# Patient Record
Sex: Female | Born: 2000
Health system: Southern US, Community
[De-identification: ages and names within clinical notes are randomized; demographics above are authoritative.]

## PROBLEM LIST (undated history)

## (undated) ENCOUNTER — Ambulatory Visit: Admission: EM | Discharge status: Absent without leave | Payer: BC Managed Care – PPO

## (undated) DIAGNOSIS — L2084 Intrinsic (allergic) eczema: Secondary | ICD-10-CM

## (undated) DIAGNOSIS — T7840XA Allergy, unspecified, initial encounter: Secondary | ICD-10-CM

## (undated) HISTORY — DX: Intrinsic (allergic) eczema: L20.84

## (undated) HISTORY — PX: INGUINAL HERNIA REPAIR: SUR1180

## (undated) HISTORY — DX: Allergy, unspecified, initial encounter: T78.40XA

---
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
## 2020-02-25 DIAGNOSIS — F4322 Adjustment disorder with anxiety: Secondary | ICD-10-CM | POA: Diagnosis not present

## 2020-03-05 DIAGNOSIS — F4322 Adjustment disorder with anxiety: Secondary | ICD-10-CM | POA: Diagnosis not present

## 2020-03-12 DIAGNOSIS — F4322 Adjustment disorder with anxiety: Secondary | ICD-10-CM | POA: Diagnosis not present

## 2020-06-19 DIAGNOSIS — Z20828 Contact with and (suspected) exposure to other viral communicable diseases: Secondary | ICD-10-CM | POA: Diagnosis not present

## 2020-07-01 DIAGNOSIS — Z1152 Encounter for screening for COVID-19: Secondary | ICD-10-CM | POA: Diagnosis not present

## 2020-10-20 ENCOUNTER — Ambulatory Visit: Admit: 2020-10-20 | Disposition: A | Discharge status: Home / Self Care | Payer: BC Managed Care – PPO

## 2020-10-20 ENCOUNTER — Ambulatory Visit
Admission: EM | Admit: 2020-10-20 | Discharge: 2020-10-20 | Disposition: A | Discharge status: Home / Self Care | Payer: BC Managed Care – PPO | Attending: Emergency Medicine | Admitting: Emergency Medicine

## 2020-10-20 ENCOUNTER — Other Ambulatory Visit: Payer: Self-pay

## 2020-10-20 DIAGNOSIS — Z3201 Encounter for pregnancy test, result positive: Secondary | ICD-10-CM

## 2020-10-20 DIAGNOSIS — N939 Abnormal uterine and vaginal bleeding, unspecified: Secondary | ICD-10-CM | POA: Diagnosis not present

## 2020-10-20 LAB — POCT URINE PREGNANCY: Preg Test, Ur: POSITIVE — AB

## 2020-10-20 NOTE — ED Provider Notes (Signed)
[facility]    CSN: [id number] Arrival date & time: 10/20/20  1544      History   Chief Complaint Chief Complaint  Patient presents with  . Abdominal Pain    HPI Tiffany Dennis is a 19 y.o. female  Presenting for prolonged vaginal bleeding with bilateral lower abdominal cramping.  Cycle started 12/15, still persisting.  States she bleeds for typically 3-4 days.  Not currently on contraceptives.  Denies vaginal discharge, malodor, urinary symptoms.  No nausea, vomiting.  Past Medical History:  Diagnosis Date  . Allergy   . Intrinsic eczema     Patient Active Problem List   Diagnosis Date Noted  . Generalized anxiety disorder 09/30/2019  . Instability of left shoulder joint 03/13/2017    Past Surgical History:  Procedure Laterality Date  . INGUINAL HERNIA REPAIR      OB History   No obstetric history on file.      Home Medications    Prior to Admission medications   Medication Sig Start Date End Date Taking? Authorizing Provider  JUNEL FE 1/20 1-20 MG-MCG tablet Take 1 tablet by mouth daily. 09/03/19   [provider]    Family History Family History  Problem Relation Age of Onset  . Allergic rhinitis Mother   . Diabetes Father   . Hypertension Father   . Diabetes Maternal Grandmother   . Diabetes Paternal Grandfather     Social History Social History   Tobacco Use  . Smoking status: Never Smoker  . Smokeless tobacco: Never Used  Vaping Use  . Vaping Use: Never used  Substance Use Topics  . Alcohol use: No  . Drug use: No     Allergies   Lindane   Review of Systems Review of Systems  Constitutional: Negative for fatigue and fever.  Respiratory: Negative for cough and shortness of breath.   Cardiovascular: Negative for chest pain and palpitations.  Gastrointestinal: Negative for constipation and diarrhea.  Genitourinary: Positive for menstrual problem and pelvic pain. Negative for dysuria, flank pain, frequency,  hematuria, urgency, vaginal bleeding, vaginal discharge and vaginal pain.     Physical Exam Triage Vital Signs ED Triage Vitals  Enc Vitals Group     BP 10/20/20 1719 103/67     Pulse Rate 10/20/20 1719 70     Resp 10/20/20 1719 20     Temp 10/20/20 1719 98.7 F (37.1 C)     Temp Source 10/20/20 1719 Oral     SpO2 10/20/20 1719 98 %     Weight --      Height --      Head Circumference --      Peak Flow --      Pain Score 10/20/20 1708 4     Pain Loc --      Pain Edu? --      Excl. in GC? --    No data found.  Updated Vital Signs BP 103/67 (BP Location: Right Arm)   Pulse 70   Temp 98.7 F (37.1 C) (Oral)   Resp 20   LMP 10/13/2020   SpO2 98%   Visual Acuity Right Eye Distance:   Left Eye Distance:   Bilateral Distance:    Right Eye Near:   Left Eye Near:    Bilateral Near:     Physical Exam Constitutional:      General: She is not in acute distress. HENT:     Head: Normocephalic and atraumatic.  Eyes:  General: No scleral icterus.    Pupils: Pupils are equal, round, and reactive to light.  Cardiovascular:     Rate and Rhythm: Normal rate.  Pulmonary:     Effort: Pulmonary effort is normal.  Abdominal:     General: Bowel sounds are normal.     Palpations: Abdomen is soft.     Tenderness: There is no abdominal tenderness. There is no right CVA tenderness, left CVA tenderness or guarding.  Skin:    Coloration: Skin is not jaundiced or pale.  Neurological:     Mental Status: She is alert and oriented to person, place, and time.      UC Treatments / Results  Labs (all labs ordered are listed, but only abnormal results are displayed) Labs Reviewed  POCT URINE PREGNANCY - Abnormal; Notable for the following components:      Result Value   Preg Test, Ur Positive (*)    All other components within normal limits    EKG   Radiology No results found.  Procedures Procedures (including critical care time)  Medications Ordered in  UC Medications - No data to display  Initial Impression / Assessment and Plan / UC Course  I have reviewed the triage vital signs and the nursing notes.  Pertinent labs & imaging results that were available during my care of the patient were reviewed by me and considered in my medical decision making (see chart for details).     Urine pregnancy positive: Threatened VS complete abortion.  Referred to women's for further diagnostic imaging.  Return precautions discussed, pt verbalized understanding and is agreeable to plan. Final Clinical Impressions(s) / UC Diagnoses   Final diagnoses:  Positive pregnancy test  Vaginal bleeding   Discharge Instructions   None    ED Prescriptions    None     PDMP not reviewed this encounter.   Odette Fraction [place], [place] 10/20/20 1824

## 2020-10-20 NOTE — ED Triage Notes (Signed)
Pt states started her cycle on 12/15, started having strong lower abdominal cramping a week prior to starting cycle. States is on day 8 of cycle and still having lower abdominal cramping.

## 2020-10-26 ENCOUNTER — Inpatient Hospital Stay (HOSPITAL_COMMUNITY)
Admission: AD | Admit: 2020-10-26 | Discharge: 2020-10-26 | Disposition: A | Discharge status: Home / Self Care | Payer: BC Managed Care – PPO | Attending: Family Medicine | Admitting: Family Medicine

## 2020-10-26 ENCOUNTER — Encounter (HOSPITAL_COMMUNITY): Payer: Self-pay | Admitting: Family Medicine

## 2020-10-26 ENCOUNTER — Inpatient Hospital Stay (HOSPITAL_COMMUNITY): Payer: BC Managed Care – PPO

## 2020-10-26 ENCOUNTER — Other Ambulatory Visit: Payer: Self-pay

## 2020-10-26 ENCOUNTER — Ambulatory Visit: Payer: Self-pay

## 2020-10-26 DIAGNOSIS — Z3A01 Less than 8 weeks gestation of pregnancy: Secondary | ICD-10-CM | POA: Insufficient documentation

## 2020-10-26 DIAGNOSIS — O3680X0 Pregnancy with inconclusive fetal viability, not applicable or unspecified: Secondary | ICD-10-CM | POA: Diagnosis not present

## 2020-10-26 DIAGNOSIS — O209 Hemorrhage in early pregnancy, unspecified: Secondary | ICD-10-CM

## 2020-10-26 DIAGNOSIS — O26891 Other specified pregnancy related conditions, first trimester: Secondary | ICD-10-CM

## 2020-10-26 DIAGNOSIS — R1031 Right lower quadrant pain: Secondary | ICD-10-CM | POA: Insufficient documentation

## 2020-10-26 DIAGNOSIS — R109 Unspecified abdominal pain: Secondary | ICD-10-CM

## 2020-10-26 DIAGNOSIS — Z3A00 Weeks of gestation of pregnancy not specified: Secondary | ICD-10-CM | POA: Diagnosis not present

## 2020-10-26 DIAGNOSIS — O26899 Other specified pregnancy related conditions, unspecified trimester: Secondary | ICD-10-CM

## 2020-10-26 LAB — WET PREP, GENITAL
Sperm: NONE SEEN
Trich, Wet Prep: NONE SEEN
WBC, Wet Prep HPF POC: NONE SEEN
Yeast Wet Prep HPF POC: NONE SEEN

## 2020-10-26 LAB — CBC WITH DIFFERENTIAL/PLATELET
Abs Immature Granulocytes: 0 10*3/uL (ref 0.00–0.07)
Basophils Absolute: 0 10*3/uL (ref 0.0–0.1)
Basophils Relative: 0 %
Eosinophils Absolute: 0 10*3/uL (ref 0.0–0.5)
Eosinophils Relative: 0 %
HCT: 38.1 % (ref 36.0–46.0)
Hemoglobin: 13.2 g/dL (ref 12.0–15.0)
Immature Granulocytes: 0 %
Lymphocytes Relative: 44 %
Lymphs Abs: 1.8 10*3/uL (ref 0.7–4.0)
MCH: 33 pg (ref 26.0–34.0)
MCHC: 34.6 g/dL (ref 30.0–36.0)
MCV: 95.3 fL (ref 80.0–100.0)
Monocytes Absolute: 0.4 10*3/uL (ref 0.1–1.0)
Monocytes Relative: 10 %
Neutro Abs: 1.9 10*3/uL (ref 1.7–7.7)
Neutrophils Relative %: 46 %
Platelets: 235 10*3/uL (ref 150–400)
RBC: 4 MIL/uL (ref 3.87–5.11)
RDW: 12.2 % (ref 11.5–15.5)
WBC: 4.1 10*3/uL (ref 4.0–10.5)
nRBC: 0 % (ref 0.0–0.2)

## 2020-10-26 LAB — HIV ANTIBODY (ROUTINE TESTING W REFLEX): HIV Screen 4th Generation wRfx: NONREACTIVE

## 2020-10-26 LAB — ABO/RH: ABO/RH(D): O POS

## 2020-10-26 LAB — HCG, QUANTITATIVE, PREGNANCY: hCG, Beta Chain, Quant, S: 9 m[IU]/mL — ABNORMAL HIGH (ref <5)

## 2020-10-26 NOTE — Discharge Instructions (Signed)
Human Chorionic Gonadotropin Test Why am I having this test? A human chorionic gonadotropin (hCG) test is done to determine whether you are pregnant. It can also be used:  To diagnose an abnormal pregnancy.  To determine whether you have had a failed pregnancy (miscarriage) or are at risk of one. What is being tested? This test checks the level of the human chorionic gonadotropin (hCG) hormone in the blood. This hormone is produced during pregnancy by the cells that form the placenta. The placenta is the organ that grows inside your womb (uterus) to nourish a developing baby. When you are pregnant, hCG can be detected in your blood or urine 7 to 8 days before your missed period. It continues to go up for the first 8-10 weeks of pregnancy. The presence of hCG in your blood can be measured with several different types of tests. You may have:  A urine test. ? Because this hormone is eliminated from your body by your kidneys, you may have a urine test to find out whether you are pregnant. A home pregnancy test detects whether there is hCG in your urine. ? A urine test only shows whether there is hCG in your urine. It does not measure how much.  A qualitative blood test. ? You may have this type of blood test to find out if you are pregnant. ? This blood test only shows whether there is hCG in your blood. It does not measure how much.  A quantitative blood test. ? This type of blood test measures the amount of hCG in your blood. ? You may have this test to:  Diagnose an abnormal pregnancy.  Check whether you have had a miscarriage.  Determine whether you are at risk of a miscarriage. What kind of sample is taken?     Two kinds of samples may be collected to test for the hCG hormone.  Blood. It is usually collected by inserting a needle into a blood vessel.  Urine. It is usually collected by urinating into a germ-free (sterile) specimen cup. It is best to collect the sample the first  time you urinate in the morning. How do I prepare for this test? No preparation is needed for a blood test.  For the urine test:  Let your health care provider know about: ? All medicines you are taking, including vitamins, herbs, creams, and over-the-counter medicines. ? Any blood in your urine. This may interfere with the result.  Do not drink too much fluid. Drink as you normally would, or as directed by your health care provider. How are the results reported? Depending on the type of test that you have, your test results may be reported as values. Your health care provider will compare your results to normal ranges that were established after testing a large group of people (reference ranges). Reference ranges may vary among labs and hospitals. For this test, common reference ranges that show absence of pregnancy are:  Quantitative hCG blood levels: less than 5 IU/L. Other results will be reported as either positive or negative. For this test, normal results (meaning the absence of pregnancy) are:  Negative for hCG in the urine test.  Negative for hCG in the qualitative blood test. What do the results mean? Urine and qualitative blood test  A negative result could mean: ? That you are not pregnant. ? That the test was done too early in your pregnancy to detect hCG in your blood or urine. If you still have other signs   of pregnancy, the test will be repeated.  A positive result means: ? That you are most likely pregnant. Your health care provider may confirm your pregnancy with an imaging study (ultrasound) of your uterus, if needed. Quantitative blood test Results of the quantitative hCG blood test will be interpreted as follows:  Less than 5 IU/L: You are most likely not pregnant.  Greater than 25 IU/L: You are most likely pregnant.  hCG levels that are higher than expected: ? You are pregnant with twins. ? You have abnormal growths in the uterus.  hCG levels that are  rising more slowly than expected: ? You have an ectopic pregnancy (also called a tubal pregnancy).  hCG levels that are falling: ? You may be having a miscarriage. Talk with your health care provider about what your results mean. Questions to ask your health care provider Ask your health care provider, or the department that is doing the test:  When will my results be ready?  How will I get my results?  What are my treatment options?  What other tests do I need?  What are my next steps? Summary  A human chorionic gonadotropin test is done to determine whether you are pregnant.  When you are pregnant, hCG can be detected in your blood or urine 7 to 8 days before your missed period. It continues to go up for the first 8-10 weeks of pregnancy.  Your hCG level can be measured with different types of tests. You may have a urine test, a qualitative blood test, or a quantitative blood test.  Talk with your health care provider about what your results mean. This information is not intended to replace advice given to you by your health care provider. Make sure you discuss any questions you have with your health care provider. Document Revised: 09/17/2017 Document Reviewed: 09/17/2017 Elsevier Patient Education  2020 Elsevier Inc.  

## 2020-10-26 NOTE — MAU Provider Note (Addendum)
Chief Complaint:  Abdominal Pain and Possible Pregnancy    HPI: Tiffany Dennis is a 19 y.o. G1P0 at [redacted]w[redacted]d who presents to maternity admissions with chief complaint of vaginal bleeding. This is a new problem, onset about 10 days ago. Patient also c/o RLQ pain, onset coinciding with onset of bleeding. Patient's abdominal pain is suprapubic, radiates to her RLQ and is 3/10 at its apex and resolves spontaneously without intervention. She has not taken medication for this complaint  Patient's bleeding is irregular and resolved about two days ago. She denies ever saturating a pad or her underwear.   Patient presented to Urgent Care last week for symptoms and was told to report to MAU for ultrasound for possible miscarriage. Denies discharge, fever, falls, or recent illness.   Pregnancy Course: 6 weeks 2 days via LMP  Past Medical History:  Diagnosis Date  . Allergy   . Intrinsic eczema    OB History  Gravida Para Term Preterm AB Living  1            SAB IAB Ectopic Multiple Live Births               # Outcome Date GA Lbr Len/2nd Weight Sex Delivery Anes PTL Lv  1 Current            Past Surgical History:  Procedure Laterality Date  . INGUINAL HERNIA REPAIR     Family History  Problem Relation Age of Onset  . Allergic rhinitis Mother   . Diabetes Father   . Hypertension Father   . Diabetes Maternal Grandmother   . Diabetes Paternal Grandfather    Social History   Tobacco Use  . Smoking status: Never Smoker  . Smokeless tobacco: Never Used  Vaping Use  . Vaping Use: Never used  Substance Use Topics  . Alcohol use: No  . Drug use: No   Allergies  Allergen Reactions  . Lindane Swelling   Medications Prior to Admission  Medication Sig Dispense Refill Last Dose  . JUNEL FE 1/20 1-20 MG-MCG tablet Take 1 tablet by mouth daily.       I have reviewed patient's Past Medical Hx, Surgical Hx, Family Hx, Social Hx, medications and allergies.   ROS:  Review of Systems   Constitutional: Negative for fatigue.  HENT: Negative for congestion.   Respiratory: Negative for cough and shortness of breath.   Gastrointestinal: Negative for abdominal pain.  Genitourinary: Negative for vaginal bleeding, vaginal discharge and vaginal pain.  All other systems reviewed and are negative.   Physical Exam   Patient Vitals for the past 24 hrs:  BP Temp Temp src Pulse Resp SpO2 Height Weight  10/26/20 1442 106/68 98.5 F (36.9 C) Oral 77 18 100 % 5\' 7"  (1.702 m) 78.7 kg    Constitutional: Well-developed, well-nourished female in no acute distress.  Cardiovascular: normal rate  Respiratory: normal effort GI: Abd soft, non-tender, gravid appropriate for gestational age.  MS: normal ROM Neurologic: Alert and oriented x 4.     Labs: Results for orders placed or performed during the hospital encounter of 10/26/20 (from the past 24 hour(s))  CBC with Differential/Platelet     Status: None   Collection Time: 10/26/20  1:43 PM  Result Value Ref Range   WBC 4.1 4.0 - 10.5 K/uL   RBC 4.00 3.87 - 5.11 MIL/uL   Hemoglobin 13.2 12.0 - 15.0 g/dL   HCT 10/28/20 72.6 - 20.3 %   MCV 95.3 80.0 - 100.0  fL   MCH 33.0 26.0 - 34.0 pg   MCHC 34.6 30.0 - 36.0 g/dL   RDW 16.1 09.6 - 04.5 %   Platelets 235 150 - 400 K/uL   nRBC 0.0 0.0 - 0.2 %   Neutrophils Relative % 46 %   Neutro Abs 1.9 1.7 - 7.7 K/uL   Lymphocytes Relative 44 %   Lymphs Abs 1.8 0.7 - 4.0 K/uL   Monocytes Relative 10 %   Monocytes Absolute 0.4 0.1 - 1.0 K/uL   Eosinophils Relative 0 %   Eosinophils Absolute 0.0 0.0 - 0.5 K/uL   Basophils Relative 0 %   Basophils Absolute 0.0 0.0 - 0.1 K/uL   Immature Granulocytes 0 %   Abs Immature Granulocytes 0.00 0.00 - 0.07 K/uL  hCG, quantitative, pregnancy     Status: Abnormal   Collection Time: 10/26/20  1:43 PM  Result Value Ref Range   hCG, Beta Chain, Quant, S 9 (H) <5 mIU/mL  ABO/Rh     Status: None   Collection Time: 10/26/20  1:43 PM  Result Value Ref Range    ABO/RH(D) O POS    No rh immune globuloin      NOT A RH IMMUNE GLOBULIN CANDIDATE, PT RH POSITIVE Performed at [facility], [address], [place], [place] [address]     Imaging:  No evidence of intrauterine pregnancy on [place] today. Unremarkable pelvic ultrasound.  MAU Course: Orders Placed This Encounter  Procedures  . Wet prep, genital  . US OB LESS THAN 14 WEEKS WITH OB TRANSVAGINAL  . CBC with Differential/Platelet  . hCG, quantitative, pregnancy  . HIV Antibody (routine testing w rflx)  . RPR  . ABO/Rh   No orders of the defined types were placed in this encounter.   MDM: Orders Placed This Encounter  Procedures  . Wet prep, genital  . US OB LESS THAN 14 WEEKS WITH OB TRANSVAGINAL  . CBC with Differential/Platelet  . hCG, quantitative, pregnancy  . HIV Antibody (routine testing w rflx)  . RPR  . ABO/Rh  . Discharge patient   Results for orders placed or performed during the hospital encounter of 10/26/20 (from the past 24 hour(s))  CBC with Differential/Platelet     Status: None   Collection Time: 10/26/20  1:43 PM  Result Value Ref Range   WBC 4.1 4.0 - 10.5 K/uL   RBC 4.00 3.87 - 5.11 MIL/uL   Hemoglobin 13.2 12.0 - 15.0 g/dL   HCT 19.1 47.8 - 29.5 %   MCV 95.3 80.0 - 100.0 fL   MCH 33.0 26.0 - 34.0 pg   MCHC 34.6 30.0 - 36.0 g/dL   RDW 62.1 30.8 - 65.7 %   Platelets 235 150 - 400 K/uL   nRBC 0.0 0.0 - 0.2 %   Neutrophils Relative % 46 %   Neutro Abs 1.9 1.7 - 7.7 K/uL   Lymphocytes Relative 44 %   Lymphs Abs 1.8 0.7 - 4.0 K/uL   Monocytes Relative 10 %   Monocytes Absolute 0.4 0.1 - 1.0 K/uL   Eosinophils Relative 0 %   Eosinophils Absolute 0.0 0.0 - 0.5 K/uL   Basophils Relative 0 %   Basophils Absolute 0.0 0.0 - 0.1 K/uL   Immature Granulocytes 0 %   Abs Immature Granulocytes 0.00 0.00 - 0.07 K/uL  hCG, quantitative, pregnancy     Status: Abnormal   Collection Time: 10/26/20  1:43 PM  Result Value Ref Range   hCG,  Beta Chain, Quant,  S 9 (H) <5 mIU/mL  ABO/Rh     Status: None   Collection Time: 10/26/20  1:43 PM  Result Value Ref Range   ABO/RH(D) O POS    No rh immune globuloin      NOT A RH IMMUNE GLOBULIN CANDIDATE, PT RH POSITIVE Performed at [facility], [address], [place], [place] [address]   HIV Antibody (routine testing w rflx)     Status: None   Collection Time: 10/26/20  1:43 PM  Result Value Ref Range   HIV Screen 4th Generation wRfx Non Reactive Non Reactive  Wet prep, genital     Status: Abnormal   Collection Time: 10/26/20  3:41 PM   Specimen: Vaginal  Result Value Ref Range   Yeast Wet Prep HPF POC NONE SEEN NONE SEEN   Trich, Wet Prep NONE SEEN NONE SEEN   Clue Cells Wet Prep HPF POC PRESENT (A) NONE SEEN   WBC, Wet Prep HPF POC NONE SEEN NONE SEEN   Sperm NONE SEEN    US OB LESS THAN 14 WEEKS WITH OB TRANSVAGINAL  Result Date: 10/26/2020 CLINICAL DATA:  Vaginal bleeding, beta HCG not available at time of imaging EXAM: OBSTETRIC <14 WK [place] AND TRANSVAGINAL OB US TECHNIQUE: Both transabdominal and transvaginal ultrasound examinations were performed for complete evaluation of the gestation as well as the maternal uterus, adnexal regions, and pelvic cul-de-sac. Transvaginal technique was performed to assess early pregnancy. COMPARISON:  None. FINDINGS: Intrauterine gestational sac: None Yolk sac:  Not Visualized. Embryo:  Not Visualized. Cardiac Activity: Not Visualized. Maternal uterus/adnexae: The uterus is anteverted and is unremarkable. Endometrium measures 3 mm. Right ovary measures 2.8 x 1.9 x 1.6 cm and the left ovary measures 2.9 x 1.4 x 1.8 cm. No adnexal masses. No free fluid. IMPRESSION: 1. No evidence of intrauterine pregnancy on this exam. Please correlate with serum beta HCG levels. 2. Otherwise unremarkable pelvic ultrasound. Electronically Signed   By: Sharlet Salina M.D.   On: 10/26/2020 15:19    Plan: Pregnancy of unknown location Blood type O POS Discharge home in stable  condition.  Follow up appointment on Thursday at 2pm at [facility] for repeat beta hCG.     Acquanetta Belling, [place] 10/26/2020  Attestation of Supervision of Student:  I confirm that I have verified the information documented in the nurse midwife student's note and that I have also personally reperformed the history, physical exam and all medical decision making activities.  I have verified that all services and findings are accurately documented in this student's note; and I agree with management and plan as outlined in the documentation. I have also made any necessary editorial changes.  --Quant hCG of 9 in setting of uncertain LMP and irregular menstrual cycle --Discussed importance of repeat quant to differentiate between early pregnancy and recent miscarriage --Clue cells not c/w physical exam, treatment deferred --Discharge home in stable condition with ectopic precautions  Calvert Cantor, CNM [facility] [employer], [facility] 10/26/2020 9:06 PM

## 2020-10-26 NOTE — MAU Note (Signed)
This past wk, she went to [facility] for abd pain.  Pain started started a wk before expected period. Period started on 12/15, bleeding was ~10days, much longer then usual. UC said possible miscarriage, to come here for [place]

## 2020-10-27 LAB — RPR: RPR Ser Ql: NONREACTIVE

## 2020-10-27 LAB — GC/CHLAMYDIA PROBE AMP (~~LOC~~) NOT AT ARMC
Chlamydia: POSITIVE — AB
Comment: NEGATIVE
Comment: NORMAL
Neisseria Gonorrhea: NEGATIVE

## 2020-10-28 ENCOUNTER — Ambulatory Visit (INDEPENDENT_AMBULATORY_CARE_PROVIDER_SITE_OTHER): Payer: BC Managed Care – PPO | Admitting: General Practice

## 2020-10-28 ENCOUNTER — Ambulatory Visit: Payer: BC Managed Care – PPO | Attending: Internal Medicine

## 2020-10-28 ENCOUNTER — Other Ambulatory Visit: Payer: Self-pay

## 2020-10-28 ENCOUNTER — Other Ambulatory Visit: Payer: Self-pay | Admitting: Medical

## 2020-10-28 VITALS — BP 111/66 | HR 71

## 2020-10-28 DIAGNOSIS — O3680X0 Pregnancy with inconclusive fetal viability, not applicable or unspecified: Secondary | ICD-10-CM

## 2020-10-28 DIAGNOSIS — A749 Chlamydial infection, unspecified: Secondary | ICD-10-CM

## 2020-10-28 DIAGNOSIS — Z23 Encounter for immunization: Secondary | ICD-10-CM

## 2020-10-28 LAB — BETA HCG QUANT (REF LAB): hCG Quant: 2 m[IU]/mL

## 2020-10-28 MED ORDER — AZITHROMYCIN 250 MG PO TABS: 1000.0000 mg | ORAL_TABLET | Freq: Once | ORAL | 0 refills | Status: AC

## 2020-10-28 NOTE — Progress Notes (Signed)
Patient presents to office today for stat bhcg following up from MAU visit on 12/28. Patient denies pain or bleeding since visit. Discussed with patient we are monitoring your bhcg levels today, results take approximately 2 hours to finalize & will be reviewed with a provider in the office. Discussed we will then call you with results/updated plan of care. Patient verbalized understanding & provided call back number [phone].   Reviewed results with Dr Adrian Blackwater who finds bhcg results negative- patient can follow up for contraception or appt as needed.  Called patient and informed her of bhcg result of 2 and offered appt to discuss birth control. Patient verbalized understanding & would like an appt. Told patient someone from the front office would call her with an appt.   Chase Caller RN BSN 10/28/20

## 2020-10-28 NOTE — Progress Notes (Signed)
   Covid-19 Vaccination Clinic  Name:  [name]    MRN: [id number] DOB: 30-Jan-2001  10/28/2020  [name] was observed post Covid-19 immunization for 15 minutes without incident. She was provided with Vaccine Information Sheet and instruction to access the V-Safe system.   [name] was instructed to call 911 with any severe reactions post vaccine: Marland Kitchen Difficulty breathing  . Swelling of face and throat  . A fast heartbeat  . A bad rash all over body  . Dizziness and weakness   Immunizations Administered    Name Date Dose VIS Date Route   Pfizer COVID-19 Vaccine 10/28/2020  3:51 PM 0.3 mL 08/18/2020 Intramuscular   Manufacturer: [employer], [employer]   Lot: [id number]   NDC: [address]-5

## 2020-10-29 NOTE — Progress Notes (Signed)
Chart reviewed - agree with CMA/RN documentation.  ° °

## 2020-11-18 ENCOUNTER — Ambulatory Visit: Payer: BC Managed Care – PPO | Attending: Internal Medicine

## 2020-11-18 DIAGNOSIS — Z23 Encounter for immunization: Secondary | ICD-10-CM

## 2020-11-18 NOTE — Progress Notes (Signed)
   Covid-19 Vaccination Clinic  Name:  Tiffany Dennis    MRN: [id number] DOB: Sep 15, 2001  11/18/2020  [name] was observed post Covid-19 immunization for 15 minutes without incident. She was provided with Vaccine Information Sheet and instruction to access the V-Safe system.   [name] was instructed to call 911 with any severe reactions post vaccine: Marland Kitchen Difficulty breathing  . Swelling of face and throat  . A fast heartbeat  . A bad rash all over body  . Dizziness and weakness   Immunizations Administered    Name Date Dose VIS Date Route   Pfizer COVID-19 Vaccine 11/18/2020  3:54 PM 0.3 mL 08/18/2020 Intramuscular   Manufacturer: [employer], [employer]   Lot: [id number]   NDC: [address]-6

## 2020-11-23 ENCOUNTER — Encounter: Payer: BC Managed Care – PPO | Admitting: Family Medicine

## 2020-12-05 ENCOUNTER — Encounter: Payer: Self-pay | Admitting: Nurse Practitioner

## 2020-12-06 ENCOUNTER — Encounter: Payer: BC Managed Care – PPO | Admitting: Nurse Practitioner

## 2020-12-15 ENCOUNTER — Other Ambulatory Visit: Payer: Self-pay

## 2020-12-15 ENCOUNTER — Ambulatory Visit (INDEPENDENT_AMBULATORY_CARE_PROVIDER_SITE_OTHER): Payer: BC Managed Care – PPO | Admitting: Nurse Practitioner

## 2020-12-15 ENCOUNTER — Encounter: Payer: Self-pay | Admitting: Nurse Practitioner

## 2020-12-15 VITALS — BP 116/72 | HR 67

## 2020-12-15 DIAGNOSIS — Z30011 Encounter for initial prescription of contraceptive pills: Secondary | ICD-10-CM

## 2020-12-15 DIAGNOSIS — O039 Complete or unspecified spontaneous abortion without complication: Secondary | ICD-10-CM

## 2020-12-15 LAB — POCT URINALYSIS DIP (DEVICE)
Bilirubin Urine: NEGATIVE
Glucose, UA: NEGATIVE mg/dL
Leukocytes,Ua: NEGATIVE
Nitrite: NEGATIVE
Protein, ur: NEGATIVE mg/dL
Specific Gravity, Urine: 1.025 (ref 1.005–1.030)
Urobilinogen, UA: 1 mg/dL (ref 0.0–1.0)
pH: 7 (ref 5.0–8.0)

## 2020-12-15 LAB — POCT PREGNANCY, URINE: Preg Test, Ur: NEGATIVE

## 2020-12-15 MED ORDER — NORETHINDRONE ACET-ETHINYL EST 1.5-30 MG-MCG PO TABS: 1.0000 | ORAL_TABLET | Freq: Every day | ORAL | 2 refills | Status: DC

## 2020-12-15 NOTE — Progress Notes (Signed)
   GYNECOLOGY OFFICE VISIT NOTE   History:  20 y.o. G1P0 here today for miscarriage follow up. She denies any abnormal vaginal discharge, bleeding, pelvic pain or other concerns.   Past Medical History:  Diagnosis Date  . Allergy   . Intrinsic eczema     Past Surgical History:  Procedure Laterality Date  . INGUINAL HERNIA REPAIR      The following portions of the patient's history were reviewed and updated as appropriate: allergies, current medications, past family history, past medical history, past social history, past surgical history and problem list.   Health Maintenance:  Normal pap NA  Review of Systems:  Pertinent items noted in HPI and remainder of comprehensive ROS otherwise negative.  Objective:  Physical Exam LMP 09/12/2020   Breastfeeding Unknown 116/72 CONSTITUTIONAL: Well-developed, well-nourished female in no acute distress.  HENT:  Normocephalic, atraumatic. External right and left ear normal.  EYES: Conjunctivae and EOM are normal. Pupils are equal, round.  No scleral icterus.  NECK: Normal range of motion, supple,  SKIN: Skin is warm and dry. No rash noted. Not diaphoretic. No erythema. No pallor. NEUROLOGIC: Alert and oriented to person, place, and time. Normal muscle tone coordination. No cranial nerve deficit noted. PSYCHIATRIC: Normal mood and affect. Normal behavior. Normal judgment and thought content. CARDIOVASCULAR: Normal heart rate noted RESPIRATORY: Effort and breath sounds normal, no problems with respiration noted ABDOMEN: Soft, no distention noted.   PELVIC: Deferred MUSCULOSKELETAL: Normal range of motion. No edema noted.  Labs and Imaging No results found.  Assessment & Plan:  1. Miscarriage Did not realize she was pregnant when she had a miscarriage.  Does not want children at this time.  Had unprotected sex about 10 days ago.  POC pregnancy test today is negative.  Reviewed visit at [facility] with miscarriage documented.  Wants to start  birth control pills.  Was on pills from ages [age].  Prescribed today and reviewed how to start the pills. Advised condoms during the first pack of pills to avoid a pregnancy.  2.  Encounter for prescription of birth control pills  Routine preventative health maintenance measures emphasized. Please refer to After Visit Summary for other counseling recommendations.   Return in about 2 months (around 02/12/2021) for RN visit for BP check and pill refill.   Total face-to-face time with patient: 10 minutes.  Over 50% of encounter was spent on counseling and coordination of care.  Nolene Bernheim, RN, MSN, NP-BC Nurse Practitioner, [facility] for [employer], [facility] 12/15/2020 3:07 PM

## 2021-02-10 ENCOUNTER — Ambulatory Visit: Payer: BC Managed Care – PPO | Admitting: Pharmacist

## 2021-08-30 ENCOUNTER — Telehealth: Payer: Self-pay | Admitting: Family Medicine

## 2021-08-30 NOTE — Telephone Encounter (Signed)
Called patient and left a detailed message about appointment change and also for her to call the office back.

## 2021-08-31 ENCOUNTER — Encounter: Payer: BC Managed Care – PPO | Admitting: Obstetrics and Gynecology

## 2021-09-15 ENCOUNTER — Other Ambulatory Visit (HOSPITAL_COMMUNITY)
Admission: RE | Admit: 2021-09-15 | Discharge: 2021-09-15 | Disposition: A | Discharge status: Home / Self Care | Payer: BC Managed Care – PPO | Source: Ambulatory Visit | Attending: Nurse Practitioner | Admitting: Nurse Practitioner

## 2021-09-15 ENCOUNTER — Encounter: Payer: Self-pay | Admitting: Nurse Practitioner

## 2021-09-15 ENCOUNTER — Other Ambulatory Visit: Payer: Self-pay

## 2021-09-15 ENCOUNTER — Ambulatory Visit: Payer: BC Managed Care – PPO | Admitting: Nurse Practitioner

## 2021-09-15 VITALS — BP 110/70 | HR 85 | Ht 67.0 in | Wt 190.2 lb

## 2021-09-15 DIAGNOSIS — R102 Pelvic and perineal pain: Secondary | ICD-10-CM

## 2021-09-15 NOTE — Progress Notes (Signed)
Patient reports lower back pain, abdominal pain and vaginal bleeding/clotting . Patient started experiencing symptoms roughly October 27/28 during her last menstrual period and compared the sensation to miscarriage she had last year    Tiffany Dennis, [facility]   09/15/21

## 2021-09-15 NOTE — Progress Notes (Signed)
   GYNECOLOGY OFFICE VISIT NOTE   History:  20 y.o. G1P0010 here today for continued abdominal pain and back pain after miscarriage.  Was prescribed birth control pills. Reports she has not taken any of the pills - never got them filled.  Is concerned as she had heavier bleeding with her last menses - 4 days and more clotting than she has had in the past.  Discussed taking pills for management of menstrual cycle - lighter bleeding, less cramping and less days of bleeding.  Patient declines prescription at this time.  She denies any abnormal vaginal discharge, bleeding, pelvic pain or other concerns.   Past Medical History:  Diagnosis Date   Allergy    Intrinsic eczema     Past Surgical History:  Procedure Laterality Date   INGUINAL HERNIA REPAIR      The following portions of the patient's history were reviewed and updated as appropriate: allergies, current medications, past family history, past medical history, past social history, past surgical history and problem list.   Health Maintenance:  Pap not indicated - age 28   Review of Systems:  Pertinent items noted in HPI and remainder of comprehensive ROS otherwise negative.  Objective:  Physical Exam BP 110/70   Pulse 85   Ht 5\' 7"  (1.702 m)   Wt 190 lb 3.2 oz (86.3 kg)   LMP 08/25/2021 (Approximate)   Breastfeeding No   BMI 29.79 kg/m  CONSTITUTIONAL: Well-developed, well-nourished female in no acute distress.  HENT:  Normocephalic, atraumatic. External right and left ear normal.  EYES: Conjunctivae and EOM are normal. Pupils are equal, round.  No scleral icterus.  NECK: Normal range of motion, supple, no masses SKIN: Skin is warm and dry. No rash noted. Not diaphoretic. No erythema. No pallor. NEUROLOGIC: Alert and oriented to person, place, and time. Normal muscle tone coordination. No cranial nerve deficit noted. PSYCHIATRIC: Normal mood and affect. Normal behavior. Normal judgment and thought content. CARDIOVASCULAR:  Normal heart rate noted RESPIRATORY: Effort and breath sounds normal, no problems with respiration noted ABDOMEN: Soft, no distention noted.   PELVIC:  Bimanual exam done - cervix nontender, uterus displaced to right,. Nontender with palpation MUSCULOSKELETAL: Normal range of motion. No edema noted.  Labs and Imaging No results found.  Assessment & Plan:  1. Pelvic pain Patient is very concerned that something is wrong with right sided abdominal pain and right sided low back pain.  Sometimes has LLQ abdominal and left back pain but mostly her pain is focused on the right side. Will test for infection as a cause of pain and with uterus more to the right, will get ultrasound.   Discussed reviewing results with patient via MyChart and will schedule additional appointments as needed.  - 08/27/2021 PELVIC COMPLETE WITH TRANSVAGINAL; Future   Routine preventative health maintenance measures emphasized. Please refer to After Visit Summary for other counseling recommendations.    Total face-to-face time with patient: 15 minutes.  Over 50% of encounter was spent on counseling and coordination of care.  [place], RN, MSN, NP-BC Nurse Practitioner, [facility] for [facility] 09/15/2021 1:51 PM

## 2021-09-19 LAB — CERVICOVAGINAL ANCILLARY ONLY
Bacterial Vaginitis (gardnerella): POSITIVE — AB
Candida Glabrata: NEGATIVE
Candida Vaginitis: POSITIVE — AB
Chlamydia: NEGATIVE
Comment: NEGATIVE
Comment: NEGATIVE
Comment: NEGATIVE
Comment: NEGATIVE
Comment: NEGATIVE
Comment: NORMAL
Neisseria Gonorrhea: NEGATIVE
Trichomonas: NEGATIVE

## 2021-09-20 MED ORDER — FLUCONAZOLE 150 MG PO TABS: 150.0000 mg | ORAL_TABLET | Freq: Once | ORAL | 0 refills | Status: AC

## 2021-09-20 MED ORDER — METRONIDAZOLE 500 MG PO TABS: 500.0000 mg | ORAL_TABLET | Freq: Two times a day (BID) | ORAL | 0 refills | Status: DC

## 2021-09-20 NOTE — Addendum Note (Signed)
Addended by: Currie Paris on: 09/20/2021 12:37 PM   Modules accepted: Orders

## 2021-09-21 ENCOUNTER — Other Ambulatory Visit: Payer: Self-pay

## 2021-09-21 ENCOUNTER — Ambulatory Visit (HOSPITAL_COMMUNITY)
Admission: RE | Admit: 2021-09-21 | Discharge: 2021-09-21 | Disposition: A | Discharge status: Home / Self Care | Payer: BC Managed Care – PPO | Source: Ambulatory Visit | Attending: Nurse Practitioner | Admitting: Nurse Practitioner

## 2021-09-21 DIAGNOSIS — R102 Pelvic and perineal pain: Secondary | ICD-10-CM | POA: Diagnosis not present

## 2021-12-08 ENCOUNTER — Other Ambulatory Visit: Payer: Self-pay

## 2021-12-08 ENCOUNTER — Ambulatory Visit
Admission: RE | Admit: 2021-12-08 | Discharge: 2021-12-08 | Disposition: A | Discharge status: Home / Self Care | Payer: BC Managed Care – PPO | Source: Ambulatory Visit | Attending: Physician Assistant | Admitting: Physician Assistant

## 2021-12-08 VITALS — BP 101/56 | HR 75 | Temp 98.2°F | Resp 18 | Ht 67.0 in | Wt 190.0 lb

## 2021-12-08 DIAGNOSIS — M79602 Pain in left arm: Secondary | ICD-10-CM | POA: Diagnosis not present

## 2021-12-08 NOTE — Discharge Instructions (Signed)
°  Please follow up with PCP as scheduled.

## 2021-12-08 NOTE — ED Provider Notes (Signed)
[facility]    CSN: [id number] Arrival date & time: 12/08/21  0949      History   Chief Complaint Chief Complaint  Patient presents with   Appointment    Pain on left side of body    HPI Tiffany Dennis is a 21 y.o. female.   Patient here today for evaluation of aching and sensation of pins and needles to the left side of her body (arm, leg)that has been occurring for the last 2 weeks. She reports symptoms are intermittent throughout the day and she has not been able to identify any triggers. She denies any true numbness. She has not had any known injury. She denies chest pain or shortness of breath.   She also notes she has not had menstrual period in over 6 weeks. She has taken multiple pregnancy tests that have been negative.   The history is provided by the patient.   Past Medical History:  Diagnosis Date   Allergy    Intrinsic eczema     Patient Active Problem List   Diagnosis Date Noted   Generalized anxiety disorder 09/30/2019   Intrinsic eczema 05/29/2017   Instability of left shoulder joint 03/13/2017   Frequent headaches 10/26/2015    Past Surgical History:  Procedure Laterality Date   INGUINAL HERNIA REPAIR      OB History     Gravida  1   Para      Term      Preterm      AB  1   Living         SAB  1   IAB      Ectopic      Multiple      Live Births               Home Medications    Prior to Admission medications   Medication Sig Start Date End Date Taking? Authorizing Provider  metroNIDAZOLE (FLAGYL) 500 MG tablet Take 1 tablet (500 mg total) by mouth 2 (two) times daily. No alcohol while taking this medication 09/20/21   Virginia Rochester, NP  Norethindrone Acetate-Ethinyl Estradiol (LOESTRIN 1.5/30, 21,) 1.5-30 MG-MCG tablet Take 1 tablet by mouth daily. Patient not taking: Reported on 09/15/2021 12/15/20   Virginia Rochester, NP    Family History Family History  Problem Relation Age of Onset   Allergic  rhinitis Mother    Diabetes Father    Hypertension Father    Diabetes Maternal Grandmother    Diabetes Paternal Grandfather     Social History Social History   Tobacco Use   Smoking status: Never   Smokeless tobacco: Never  Vaping Use   Vaping Use: Never used  Substance Use Topics   Alcohol use: No   Drug use: No     Allergies   Lindane   Review of Systems Review of Systems  Constitutional:  Negative for chills and fever.  Eyes:  Negative for discharge and redness.  Respiratory:  Negative for shortness of breath.   Cardiovascular:  Negative for chest pain.  Gastrointestinal:  Negative for abdominal pain, nausea and vomiting.  Musculoskeletal:  Negative for back pain and neck pain.  Neurological:  Negative for numbness and headaches.    Physical Exam Triage Vital Signs ED Triage Vitals  Enc Vitals Group     BP      Pulse      Resp      Temp      Temp  src      SpO2      Weight      Height      Head Circumference      Peak Flow      Pain Score      Pain Loc      Pain Edu?      Excl. in [place]?    No data found.  Updated Vital Signs BP (!) 101/56 (BP Location: Right Arm)    Pulse 75    Temp 98.2 F (36.8 C) (Oral)    Resp 18    Ht 5\' 7"  (1.702 m)    Wt 190 lb (86.2 kg)    LMP 10/23/2021    SpO2 98%    BMI 29.76 kg/m      Physical Exam Vitals and nursing note reviewed.  Constitutional:      General: She is not in acute distress.    Appearance: Normal appearance. She is not ill-appearing.  HENT:     Head: Normocephalic and atraumatic.  Eyes:     Conjunctiva/sclera: Conjunctivae normal.  Cardiovascular:     Rate and Rhythm: Normal rate.  Pulmonary:     Effort: Pulmonary effort is normal.  Neurological:     Mental Status: She is alert.  Psychiatric:        Mood and Affect: Mood normal.        Behavior: Behavior normal.        Thought Content: Thought content normal.     UC Treatments / Results  Labs (all labs ordered are listed, but only  abnormal results are displayed) Labs Reviewed  CBC WITH DIFFERENTIAL/PLATELET  COMPREHENSIVE METABOLIC PANEL  TSH  BETA HCG QUANT (REF LAB)    EKG   Radiology No results found.  Procedures Procedures (including critical care time)  Medications Ordered in UC Medications - No data to display  Initial Impression / Assessment and Plan / UC Course  I have reviewed the triage vital signs and the nursing notes.  Pertinent labs & imaging results that were available during my care of the patient were reviewed by me and considered in my medical decision making (see chart for details).    Unclear etiology of symptoms. Will order routine labs and appointment made for follow up with PCP. Encouraged sooner follow up with any further concerns.   Final Clinical Impressions(s) / UC Diagnoses   Final diagnoses:  Left arm pain     Discharge Instructions       Please follow up with PCP as scheduled.       ED Prescriptions   None    PDMP not reviewed this encounter.   Francene Finders, PA-C 12/08/21 1051

## 2021-12-08 NOTE — ED Triage Notes (Signed)
Patient c/o left sided body pain, pain can range from left arm, side down to her leg.  Pain is described as a pulsating pain.  No injury.  Sx's come and go x 2 weeks.  Patient has not had a cycle since 10/23/2021.  Patient has taken multiple pregnancy tests all were negative.

## 2021-12-10 LAB — CBC WITH DIFFERENTIAL/PLATELET
Basophils Absolute: 0 10*3/uL (ref 0.0–0.2)
Basos: 0 %
EOS (ABSOLUTE): 0 10*3/uL (ref 0.0–0.4)
Eos: 0 %
Hematocrit: 37.7 % (ref 34.0–46.6)
Hemoglobin: 13.1 g/dL (ref 11.1–15.9)
Immature Grans (Abs): 0 10*3/uL (ref 0.0–0.1)
Immature Granulocytes: 0 %
Lymphocytes Absolute: 2 10*3/uL (ref 0.7–3.1)
Lymphs: 49 %
MCH: 33.1 pg — ABNORMAL HIGH (ref 26.6–33.0)
MCHC: 34.7 g/dL (ref 31.5–35.7)
MCV: 95 fL (ref 79–97)
Monocytes Absolute: 0.4 10*3/uL (ref 0.1–0.9)
Monocytes: 10 %
Neutrophils Absolute: 1.7 10*3/uL (ref 1.4–7.0)
Neutrophils: 41 %
Platelets: 215 10*3/uL (ref 150–450)
RBC: 3.96 x10E6/uL (ref 3.77–5.28)
RDW: 11.7 % (ref 11.7–15.4)
WBC: 4.2 10*3/uL (ref 3.4–10.8)

## 2021-12-10 LAB — COMPREHENSIVE METABOLIC PANEL
ALT: 15 IU/L (ref 0–32)
AST: 17 IU/L (ref 0–40)
Albumin/Globulin Ratio: 1.1 — ABNORMAL LOW (ref 1.2–2.2)
Albumin: 3.8 g/dL — ABNORMAL LOW (ref 3.9–5.0)
Alkaline Phosphatase: 75 IU/L (ref 42–106)
BUN/Creatinine Ratio: 9 (ref 9–23)
BUN: 7 mg/dL (ref 6–20)
Bilirubin Total: <0.2 mg/dL (ref 0.0–1.2)
CO2: 19 mmol/L — ABNORMAL LOW (ref 20–29)
Calcium: 8.7 mg/dL (ref 8.7–10.2)
Chloride: 102 mmol/L (ref 96–106)
Creatinine, Ser: 0.74 mg/dL (ref 0.57–1.00)
Globulin, Total: 3.5 g/dL (ref 1.5–4.5)
Glucose: 84 mg/dL (ref 70–99)
Potassium: 3.7 mmol/L (ref 3.5–5.2)
Sodium: 136 mmol/L (ref 134–144)
Total Protein: 7.3 g/dL (ref 6.0–8.5)
eGFR: 119 mL/min/{1.73_m2} (ref >59)

## 2021-12-10 LAB — BETA HCG QUANT (REF LAB): hCG Quant: <1 m[IU]/mL

## 2021-12-10 LAB — TSH: TSH: 1.83 u[IU]/mL (ref 0.450–4.500)

## 2021-12-12 ENCOUNTER — Encounter: Payer: Self-pay | Admitting: Family

## 2021-12-12 NOTE — Progress Notes (Signed)
Subjective:    Tiffany Dennis - 21 y.o. female MRN [id number]  Date of birth: 08/17/2001  HPI  Tiffany Dennis is to establish care.   Current issues and/or concerns: URGENT CARE FOLLOW-UP: 12/08/2021 at [facility] per PA note: Unclear etiology of symptoms. Will order routine labs and appointment made for follow up with PCP. Encouraged sooner follow up with any further concerns.   12/13/2021: Symptoms persisting since Urgent Care visit. History of dislocated left shoulder in high school, no surgery needed. Denies recent trauma/injury.Today denies chest pain, shortness of breath, swelling/redness of left lower extremity, and additional red flag symptoms.     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 She has never been exposed to tobacco smoke. She has never used smokeless tobacco. She reports that she does not drink alcohol and does not use drugs.   Family History  family history includes Allergic rhinitis in her mother; Diabetes in her father, maternal grandmother, and paternal grandfather; Hypertension in her father.   Medications: reviewed and updated   Objective:   Physical Exam BP 102/67 (BP Location: Left Arm, Patient Position: Sitting, Cuff Size: Normal)    Pulse 78    Temp 98.3 F (36.8 C)    Resp 18    Ht 5' 6.69" (1.694 m)    Wt 194 lb 6.4 oz (88.2 kg)    SpO2 97%    BMI 30.73 kg/m   Physical Exam HENT:     Head: Normocephalic and atraumatic.  Eyes:     Extraocular Movements: Extraocular movements intact.     Conjunctiva/sclera: Conjunctivae normal.      Pupils: Pupils are equal, round, and reactive to light.  Cardiovascular:     Rate and Rhythm: Normal rate and regular rhythm.     Pulses: Normal pulses.     Heart sounds: Normal heart sounds.  Musculoskeletal:        General: Normal range of motion.     Right shoulder: Normal.     Left shoulder: Normal.     Right upper arm: Normal.     Left upper arm: Normal.     Cervical back: Normal range of motion and neck supple.     Right lower leg: Normal.     Left lower leg: Normal.  Skin:    General: Skin is warm and dry.  Neurological:     General: No focal deficit present.     Mental Status: She is alert and oriented to person, place, and time.  Psychiatric:        Mood and Affect: Mood normal.        Behavior: Behavior normal.       Assessment & Plan:  1. Encounter to establish care: - Patient presents today to establish care.  - Return for annual physical examination, labs, and health maintenance. Arrive fasting meaning having no food for at least 8 hours prior to appointment. You may have only water or black coffee. Please take scheduled medications as normal.  2. Encounter to discuss test results: - Confirmed  with patient labs from Urgent Care visit 12/08/2021 essentially normal.   3. Left leg pain: 4. Left arm pain: - Begin Gabapentin as prescribed. Counseled on medication adherence and adverse effects. - Referral to Orthopedic Surgery for further evaluation and management.  - Follow-up with primary provider as scheduled. - gabapentin (NEURONTIN) 300 MG capsule; Take 1 capsule (300 mg total) by mouth at bedtime.  Dispense: 30 capsule; Refill: 0 - Ambulatory referral to Orthopedic Surgery  5. Screening cholesterol level: - Patient not fasting.  - Patient will return to office for fasting cholesterol when best for her.   6. BMI 30.0-30.9,adult: - Counseled on low-sodium, DASH diet, and 150 minutes of moderate intensity exercise per week as tolerated.     Patient was given  clear instructions to go to Emergency Department or return to medical center if symptoms don't improve, worsen, or new problems develop.The patient verbalized understanding.  I discussed the assessment and treatment plan with the patient. The patient was provided an opportunity to ask questions and all were answered. The patient agreed with the plan and demonstrated an understanding of the instructions.   The patient was advised to call back or seek an in-person evaluation if the symptoms worsen or if the condition fails to improve as anticipated.    Ricky Stabs, NP 12/13/2021, 10:56 AM Primary Care at [facility]

## 2021-12-13 ENCOUNTER — Ambulatory Visit (INDEPENDENT_AMBULATORY_CARE_PROVIDER_SITE_OTHER): Payer: BC Managed Care – PPO | Admitting: Family

## 2021-12-13 ENCOUNTER — Encounter: Payer: Self-pay | Admitting: Family

## 2021-12-13 ENCOUNTER — Other Ambulatory Visit: Payer: Self-pay

## 2021-12-13 VITALS — BP 102/67 | HR 78 | Temp 98.3°F | Resp 18 | Ht 66.69 in | Wt 194.4 lb

## 2021-12-13 DIAGNOSIS — M79602 Pain in left arm: Secondary | ICD-10-CM

## 2021-12-13 DIAGNOSIS — M79605 Pain in left leg: Secondary | ICD-10-CM

## 2021-12-13 DIAGNOSIS — Z6830 Body mass index (BMI) 30.0-30.9, adult: Secondary | ICD-10-CM

## 2021-12-13 DIAGNOSIS — Z13220 Encounter for screening for lipoid disorders: Secondary | ICD-10-CM

## 2021-12-13 DIAGNOSIS — Z7689 Persons encountering health services in other specified circumstances: Secondary | ICD-10-CM | POA: Diagnosis not present

## 2021-12-13 DIAGNOSIS — Z712 Person consulting for explanation of examination or test findings: Secondary | ICD-10-CM | POA: Diagnosis not present

## 2021-12-13 MED ORDER — GABAPENTIN 300 MG PO CAPS: 300.0000 mg | ORAL_CAPSULE | Freq: Every day | ORAL | 0 refills | Status: DC

## 2021-12-13 NOTE — Patient Instructions (Signed)
Thank you for choosing Primary Care at [facility] for your medical home!    Tiffany Dennis was seen by Rema Fendt, NP today.   Tiffany Dennis's primary care provider is Ricky Stabs, NP.   For the best care possible,  you should try to see Ricky Stabs, NP whenever you come to clinic.   We look forward to seeing you again soon!  If you have any questions about your visit today,  please call us at [phone]  Or feel free to reach your provider via MyChart.    Keeping you healthy   Get these tests Blood pressure- Have your blood pressure checked once a year by your healthcare provider.  Normal blood pressure is 120/80. Weight- Have your body mass index (BMI) calculated to screen for obesity.  BMI is a measure of body fat based on height and weight. You can also calculate your own BMI at https://www.west-esparza.com/. Cholesterol- Have your cholesterol checked regularly starting at age 33, sooner may be necessary if you have diabetes, high blood pressure, if a family member developed heart diseases at an early age or if you smoke.  Chlamydia, HIV, and other sexual transmitted disease- Get screened each year until the age of 85 then within three months of each new sexual partner. Diabetes- Have your blood sugar checked regularly if you have high blood pressure, high cholesterol, a family history of diabetes or if you are overweight.   Get these vaccines Flu shot- Every fall. Tetanus shot- Every 10 years. Menactra- Single dose; prevents meningitis.   Take these steps Don't smoke- If you do smoke, ask your healthcare provider about quitting. For tips on how to quit, go to www.smokefree.gov or call 1-800-QUIT-NOW. Be physically active- Exercise 5 days a week for at least 30 minutes.  If you are not already physically active start slow and gradually work up to 30 minutes of moderate physical activity.  Examples of moderate activity include walking briskly, mowing the yard, dancing,  swimming bicycling, etc. Eat a healthy diet- Eat a variety of healthy foods such as fruits, vegetables, low fat milk, low fat cheese, yogurt, lean meats, poultry, fish, beans, tofu, etc.  For more information on healthy eating, go to www.thenutritionsource.org Drink alcohol in moderation- Limit alcohol intake two drinks or less a day.  Never drink and drive. Dentist- Brush and floss teeth twice daily; visit your dentis twice a year. Depression-Your emotional health is as important as your physical health.  If you're feeling down, losing interest in things you normally enjoy please talk with your healthcare provider. Gun Safety- If you keep a gun in your home, keep it unloaded and with the safety lock on.  Bullets should be stored separately. Helmet use- Always wear a helmet when riding a motorcycle, bicycle, rollerblading or skateboarding. Safe sex- If you may be exposed to a sexually transmitted infection, use a condom Seat belts- Seat bels can save your life; always wear one. Smoke/Carbon Monoxide detectors- These detectors need to be installed on the appropriate level of your home.  Replace batteries at least once a year. Skin Cancer- When out in the sun, cover up and use sunscreen SPF 15 or higher. Violence- If anyone is threatening or hurting you, please tell your healthcare provider.

## 2021-12-13 NOTE — Progress Notes (Signed)
Pt presents to establish care, pt states that she was seen by urgent care on 12/08/21, and although labs came back normal she is still experiencing left arm numbness and knee pain that radiates through leg

## 2022-01-19 NOTE — Progress Notes (Signed)
? ? ?Patient ID: Tiffany Dennis, female    DOB: [birth year]  MRN: [id number] ? ?CC: Annual Physical Exam ? ?Subjective: ?Tiffany Dennis is a 21 y.o. female who presents for annual physical exam.  ? ?Her concerns today include:  ?None. ? ?Patient Active Problem List  ? Diagnosis Date Noted  ? Generalized anxiety disorder 09/30/2019  ? Intrinsic eczema 05/29/2017  ? 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 Frequent headaches 10/26/2015  ?  ? ?Current Outpatient Medications on File Prior to Visit  ?Medication Sig Dispense Refill  ? gabapentin (NEURONTIN) 300 MG capsule Take 1 capsule (300 mg total) by mouth at bedtime. 30 capsule 0  ? ?No current facility-administered medications on file prior to visit.  ? ? ?Allergies  ?Allergen Reactions  ? Lindane Swelling  ? ? ?Social History  ? ?Socioeconomic History  ? Marital status: Single  ?  Spouse name: Not on file  ? Number of children: 0  ? Years of education: Not on file  ? Highest education level: Not on file  ?Occupational History  ? Not on file  ?Tobacco Use  ? Smoking status: Never  ?  Passive exposure: Never  ? Smokeless tobacco: Never  ?Vaping Use  ? Vaping Use: Every day  ?Substance and Sexual Activity  ? Alcohol use: No  ? Drug use: No  ? Sexual activity: Yes  ?  Birth control/protection: None  ?Other Topics Concern  ? Not on file  ?Social History Narrative  ? Not on file  ? ?Social Determinants of Health  ? ?Financial Resource Strain: Not on file  ?Food Insecurity: No Food Insecurity  ? Worried About Programme researcher, broadcasting/film/video in the Last Year: Never true  ? Ran Out of Food in the Last Year: Never true  ?Transportation Needs: No Transportation Needs  ? Lack of Transportation (Medical): No  ? Lack of Transportation (Non-Medical): No  ?Physical Activity: Not on file  ?Stress: Not on file  ?Social Connections: Not on file  ?Intimate Partner Violence: Not on file  ? ? ?Family History  ?Problem Relation Age of Onset  ? Allergic rhinitis Mother   ? Diabetes Father   ?  Hypertension Father   ? Diabetes Maternal Grandmother   ? Diabetes Paternal Grandfather   ? ? ?Past Surgical History:  ?Procedure Laterality Date  ? INGUINAL HERNIA REPAIR    ? ? ?ROS: ?Review of Systems ?Negative except as stated above ? ?PHYSICAL EXAM: ?BP 99/68 (BP Location: Left Arm, Patient Position: Sitting, Cuff Size: Large)   Pulse 78   Temp 98.3 ?F (36.8 ?C)   Resp 18   Ht 5' 6.69" (1.694 m)   Wt 190 lb (86.2 kg)   SpO2 98%   BMI 30.03 kg/m?  ? ?Physical Exam ?Constitutional:   ?   Appearance: Normal appearance.  ?HENT:  ?   Head: Normocephalic and atraumatic.  ?   Right Ear: Tympanic membrane, ear canal and external ear normal.  ?   Left Ear: Tympanic membrane and external ear normal.  ?   Nose: Nose normal.  ?   Mouth/Throat:  ?   Mouth: Mucous membranes are moist.  ?   Pharynx: Oropharynx is clear.  ?Eyes:  ?   Extraocular Movements: Extraocular movements intact.  ?   Conjunctiva/sclera: Conjunctivae normal.  ?   Pupils: Pupils are equal, round, and reactive to light.  ?Cardiovascular:  ?   Rate and Rhythm: Normal rate and regular rhythm.  ?  Pulses: Normal pulses.  ?   Heart sounds: Normal heart sounds.  ?Pulmonary:  ?   Effort: Pulmonary effort is normal.  ?   Breath sounds: Normal breath sounds.  ?Chest:  ?   Comments: Patient declined.  ?Abdominal:  ?   General: Bowel sounds are normal.  ?   Palpations: Abdomen is soft.  ?Genitourinary: ?   Comments: Patient declined.  ?Musculoskeletal:     ?   General: Normal range of motion.  ?   Cervical back: Normal range of motion and neck supple.  ?Skin: ?   General: Skin is warm and dry.  ?   Capillary Refill: Capillary refill takes less than 2 seconds.  ?Neurological:  ?   General: No focal deficit present.  ?   Mental Status: She is alert and oriented to person, place, and time.  ?Psychiatric:     ?   Mood and Affect: Mood normal.     ?   Behavior: Behavior normal.  ? ? ?ASSESSMENT AND PLAN: ?1. Annual physical exam: ?- Counseled on 150 minutes of  exercise per week as tolerated, healthy eating (including decreased daily intake of saturated fats, cholesterol, added sugars, sodium), STI prevention, and routine healthcare maintenance. ? ?2. Diabetes mellitus screening: ?- Hemoglobin A1c to screen for pre-diabetes/diabetes. ?- Hemoglobin A1c ? ?3. Screening cholesterol level: ?- Lipid panel to screen for high cholesterol.  ?- Lipid panel ? ?4. Need for hepatitis C screening test: ?- Hepatitis C antibody to screen for hepatitis C.  ?- Hepatitis C Antibody ? ? ?Patient was given the opportunity to ask questions.  Patient verbalized understanding of the plan and was able to repeat key elements of the plan. Patient was given clear instructions to go to Emergency Department or return to medical center if symptoms don't improve, worsen, or new problems develop.The patient verbalized understanding. ? ? ?Orders Placed This Encounter  ?Procedures  ? Hepatitis C Antibody  ? Hemoglobin A1c  ? Lipid panel  ? ? ?Requested Prescriptions  ? ? No prescriptions requested or ordered in this encounter  ? ? ?Return in about 1 year (around 01/26/2023) for Physical per patient preference. ? ?Rema Fendt, NP  ?

## 2022-01-22 IMAGING — US US OB < 14 WEEKS - US OB TV
1 series · 15 of 28 positions shown · non-contrast
Comparison: None.

CLINICAL DATA: Vaginal bleeding, beta HCG not available at time of
imaging

EXAM:
OBSTETRIC <14 WK US AND TRANSVAGINAL OB US
TECHNIQUE: Both transabdominal and transvaginal ultrasound examinations were
performed for complete evaluation of the gestation as well as the
maternal uterus, adnexal regions, and pelvic cul-de-sac.
Transvaginal technique was performed to assess early pregnancy.

[Series 1: us ob < 14 weeks - us ob tv · 15 of 42 slices shown]
[im 1/42]
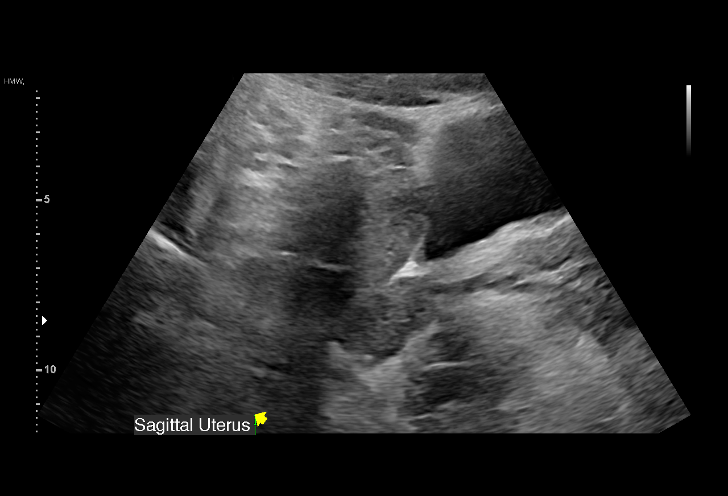
[im 4/42]
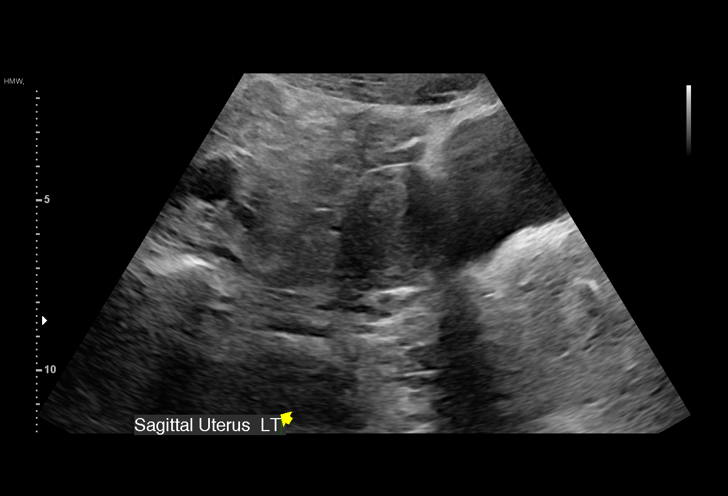
[im 7/42]
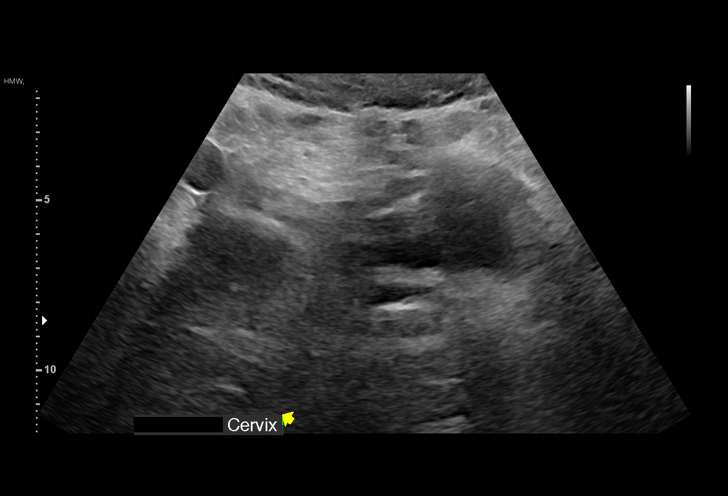
[im 10/42]
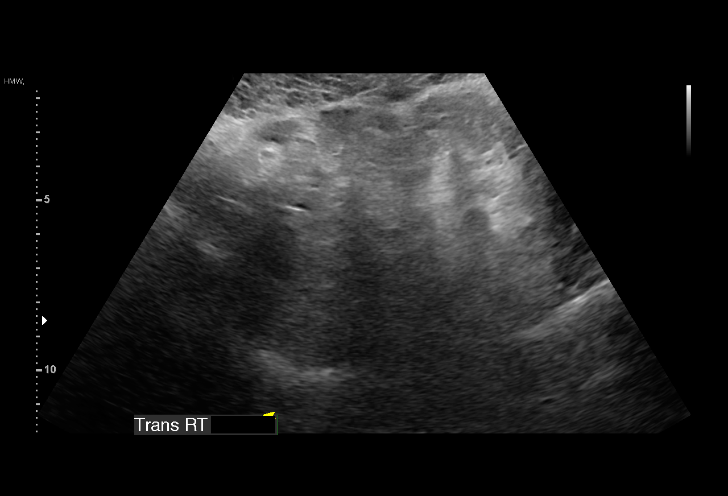
[im 13/42]
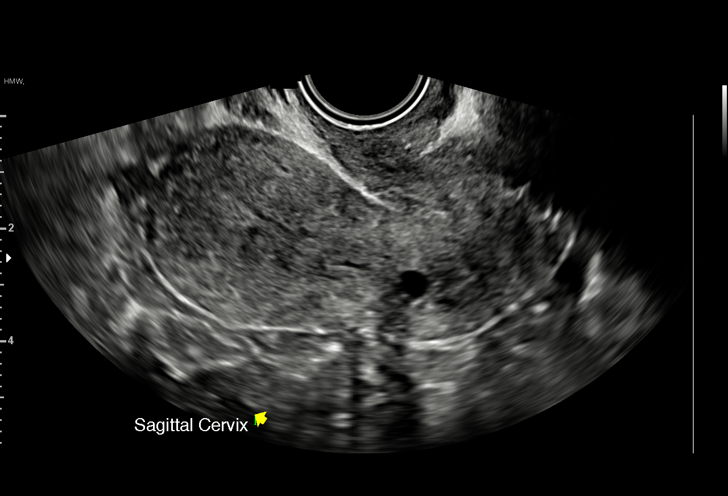
[im 16/42]
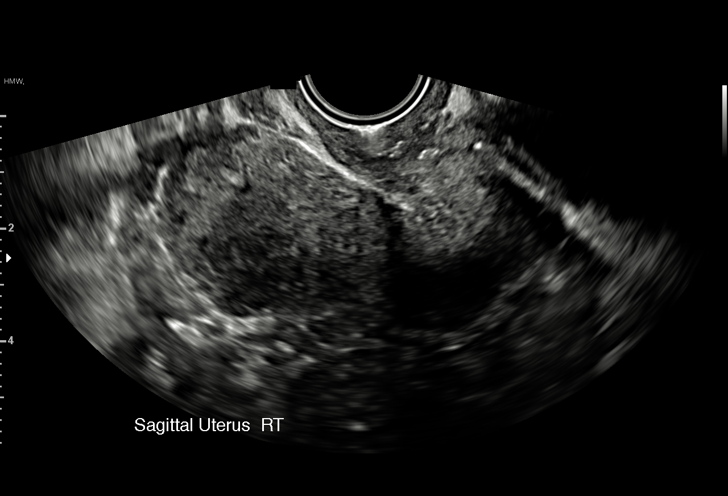
[im 19/42]
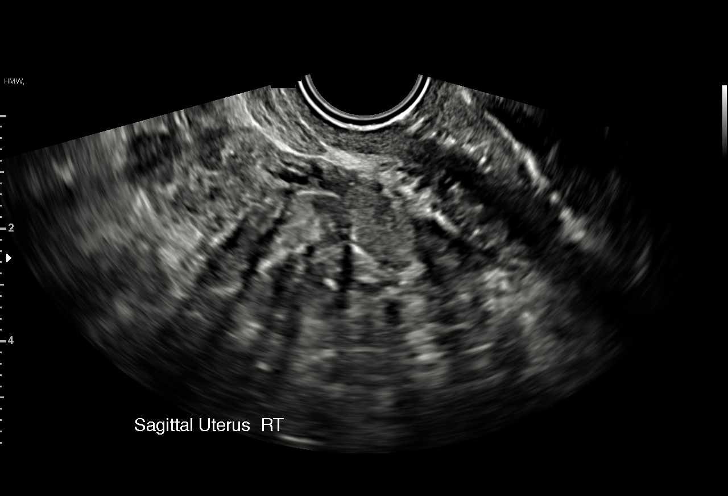
[im 22/42]
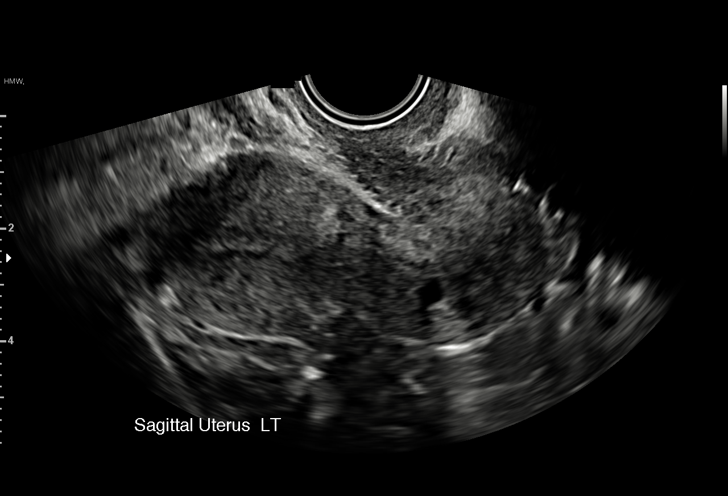
[im 23/42]
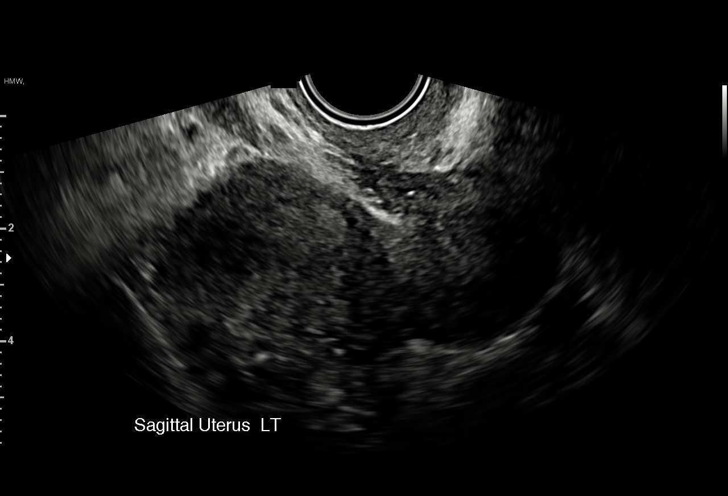
[im 26/42]
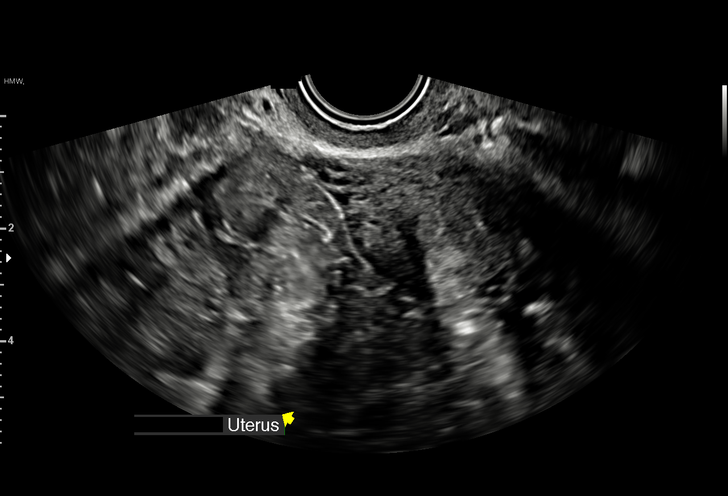
[im 29/42]
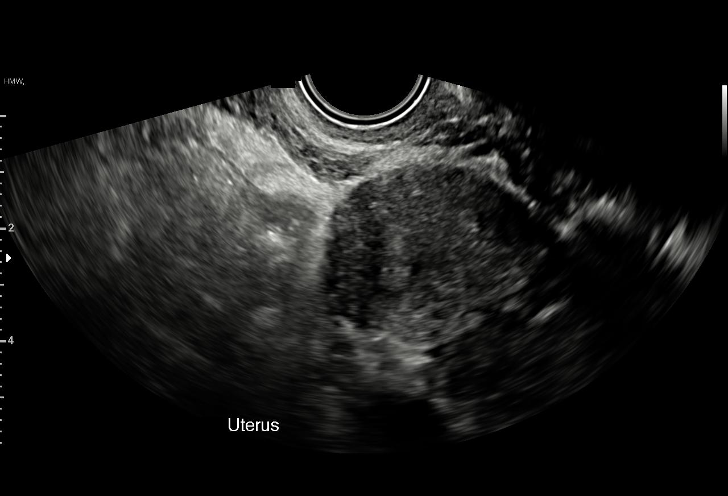
[im 32/42]
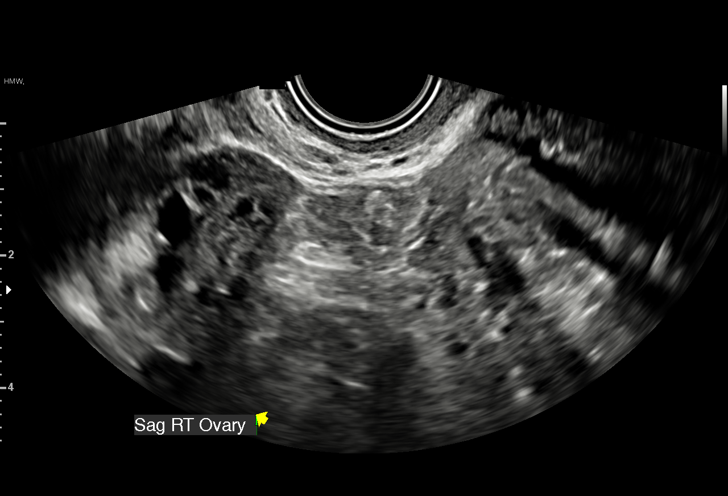
[im 35/42]
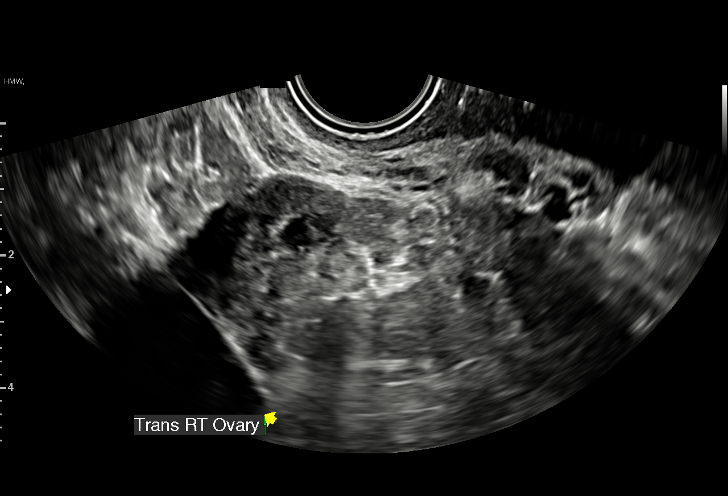
[im 38/42]
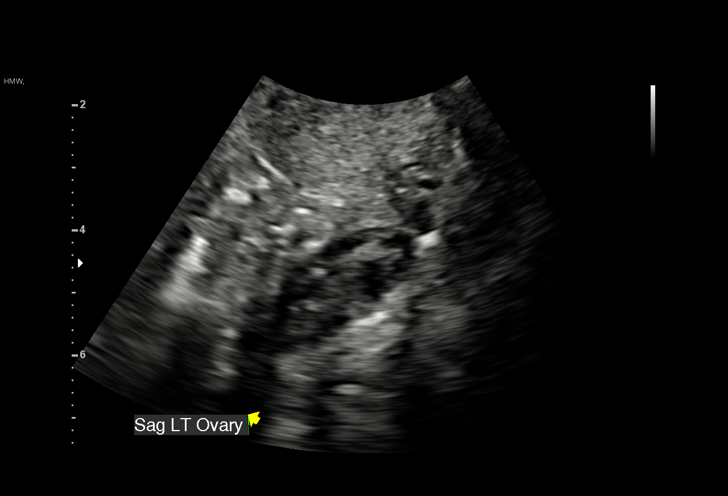
[im 42/42]
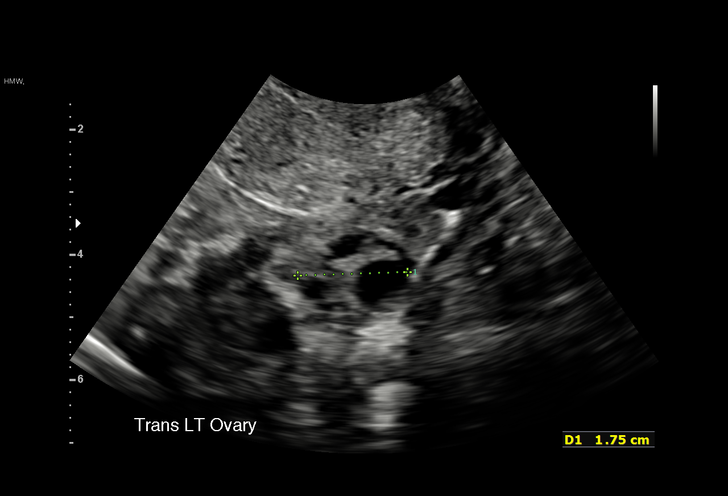

[15 of 28 positions shown; findings below may reference images not displayed]

FINDINGS: Intrauterine gestational sac: None

Yolk sac:  Not Visualized.

Embryo:  Not Visualized.

Cardiac Activity: Not Visualized.

Maternal uterus/adnexae: The uterus is anteverted and is
unremarkable. Endometrium measures 3 mm. Right ovary measures 2.8 x
1.9 x 1.6 cm and the left ovary measures 2.9 x 1.4 x 1.8 cm. No
adnexal masses. No free fluid.
IMPRESSION: 1. No evidence of intrauterine pregnancy on this exam. Please
correlate with serum beta HCG levels.
2. Otherwise unremarkable pelvic ultrasound.

## 2022-01-25 ENCOUNTER — Other Ambulatory Visit: Payer: Self-pay

## 2022-01-25 ENCOUNTER — Encounter: Payer: Self-pay | Admitting: Family

## 2022-01-25 ENCOUNTER — Ambulatory Visit (INDEPENDENT_AMBULATORY_CARE_PROVIDER_SITE_OTHER): Payer: BC Managed Care – PPO | Admitting: Family

## 2022-01-25 VITALS — BP 99/68 | HR 78 | Temp 98.3°F | Resp 18 | Ht 66.69 in | Wt 190.0 lb

## 2022-01-25 DIAGNOSIS — Z13220 Encounter for screening for lipoid disorders: Secondary | ICD-10-CM | POA: Diagnosis not present

## 2022-01-25 DIAGNOSIS — Z0000 Encounter for general adult medical examination without abnormal findings: Secondary | ICD-10-CM

## 2022-01-25 DIAGNOSIS — Z131 Encounter for screening for diabetes mellitus: Secondary | ICD-10-CM | POA: Diagnosis not present

## 2022-01-25 DIAGNOSIS — Z1159 Encounter for screening for other viral diseases: Secondary | ICD-10-CM

## 2022-01-25 NOTE — Progress Notes (Deleted)
.  Pt presents for annual physical exam  

## 2022-01-25 NOTE — Progress Notes (Signed)
.  Pt presents for annual physical exam  

## 2022-01-25 NOTE — Patient Instructions (Signed)
Preventive Care 18-21 Years Old, Female Preventive care refers to lifestyle choices and visits with your health care provider that can promote health and wellness. At this stage in your life, you may start seeing a primary care physician instead of a pediatrician for your preventive care. Preventive care visits are also called wellness exams. What can I expect for my preventive care visit? Counseling During your preventive care visit, your health care provider may ask about your: Medical history, including: Past medical problems. Family medical history. Pregnancy history. Current health, including: Menstrual cycle. Method of birth control. Emotional well-being. Home life and relationship well-being. Sexual activity and sexual health. Lifestyle, including: Alcohol, nicotine or tobacco, and drug use. Access to firearms. Diet, exercise, and sleep habits. Sunscreen use. Motor vehicle safety. Physical exam Your health care provider may check your: Height and weight. These may be used to calculate your BMI (body mass index). BMI is a measurement that tells if you are at a healthy weight. Waist circumference. This measures the distance around your waistline. This measurement also tells if you are at a healthy weight and may help predict your risk of certain diseases, such as type 2 diabetes and high blood pressure. Heart rate and blood pressure. Body temperature. Skin for abnormal spots. Breasts. What immunizations do I need? Vaccines are usually given at various ages, according to a schedule. Your health care provider will recommend vaccines for you based on your age, medical history, and lifestyle or other factors, such as travel or where you work. What tests do I need? Screening Your health care provider may recommend screening tests for certain conditions. This may include: Vision and hearing tests. Lipid and cholesterol levels. Pelvic exam and Pap test. Hepatitis B test. Hepatitis  C test. HIV (human immunodeficiency virus) test. STI (sexually transmitted infection) testing, if you are at risk. Tuberculosis skin test if you have symptoms. BRCA-related cancer screening. This may be done if you have a family history of breast, ovarian, tubal, or peritoneal cancers. Talk with your health care provider about your test results, treatment options, and if necessary, the need for more tests. Follow these instructions at home: Eating and drinking  Eat a healthy diet that includes fresh fruits and vegetables, whole grains, lean protein, and low-fat dairy products. Drink enough fluid to keep your urine pale yellow. Do not drink alcohol if: Your health care provider tells you not to drink. You are pregnant, may be pregnant, or are planning to become pregnant. You are under the legal drinking age. In the [place] the legal drinking age is 21. If you drink alcohol: Limit how much you have to 0-1 drink a day. Know how much alcohol is in your drink. In the [place] one drink equals one 12 oz bottle of beer (355 mL), one 5 oz glass of wine (148 mL), or one 1 oz glass of hard liquor (44 mL). Lifestyle Brush your teeth every morning and night with fluoride toothpaste. Floss one time each day. Exercise for at least 30 minutes 5 or more days of the week. Do not use any products that contain nicotine or tobacco. These products include cigarettes, chewing tobacco, and vaping devices, such as e-cigarettes. If you need help quitting, ask your health care provider. Do not use drugs. If you are sexually active, practice safe sex. Use a condom or other form of protection to prevent STIs. If you do not wish to become pregnant, use a form of birth control. If you plan to become pregnant, see   your health care provider for a prepregnancy visit. Find healthy ways to manage stress, such as: Meditation, yoga, or listening to music. Journaling. Talking to a trusted person. Spending time with friends and  family. Safety Always wear your seat belt while driving or riding in a vehicle. Do not drive: If you have been drinking alcohol. Do not ride with someone who has been drinking. When you are tired or distracted. While texting. If you have been using any mind-altering substances or drugs. Wear a helmet and other protective equipment during sports activities. If you have firearms in your house, make sure you follow all gun safety procedures. Seek help if you have been bullied, physically abused, or sexually abused. Use the internet responsibly to avoid dangers, such as online bullying and online sex predators. What's next? Go to your health care provider once a year for an annual wellness visit. Ask your health care provider how often you should have your eyes and teeth checked. Stay up to date on all vaccines. This information is not intended to replace advice given to you by your health care provider. Make sure you discuss any questions you have with your health care provider. Document Revised: 04/13/2021 Document Reviewed: 04/13/2021 Elsevier Patient Education  2022 [employer]vier Inc.  

## 2022-01-26 ENCOUNTER — Ambulatory Visit: Payer: BC Managed Care – PPO | Admitting: Family

## 2022-01-26 LAB — LIPID PANEL
Chol/HDL Ratio: 2.4 ratio (ref 0.0–4.4)
Cholesterol, Total: 134 mg/dL (ref 100–199)
HDL: 56 mg/dL (ref >39)
LDL Chol Calc (NIH): 69 mg/dL (ref 0–99)
Triglycerides: 37 mg/dL (ref 0–149)
VLDL Cholesterol Cal: 9 mg/dL (ref 5–40)

## 2022-01-26 LAB — HEMOGLOBIN A1C
Est. average glucose Bld gHb Est-mCnc: 108 mg/dL
Hgb A1c MFr Bld: 5.4 % (ref 4.8–5.6)

## 2022-01-26 LAB — HEPATITIS C ANTIBODY: Hep C Virus Ab: NONREACTIVE

## 2022-01-26 NOTE — Progress Notes (Signed)
No diabetes.  ? ?Cholesterol normal.  ? ?Hepatitis C negative.

## 2022-04-17 ENCOUNTER — Encounter (HOSPITAL_COMMUNITY): Payer: Self-pay | Admitting: *Deleted

## 2022-04-17 ENCOUNTER — Inpatient Hospital Stay (HOSPITAL_COMMUNITY): Payer: BC Managed Care – PPO

## 2022-04-17 ENCOUNTER — Inpatient Hospital Stay (HOSPITAL_COMMUNITY)
Admission: AD | Admit: 2022-04-17 | Discharge: 2022-04-17 | Disposition: A | Discharge status: Home / Self Care | Payer: BC Managed Care – PPO | Attending: Family Medicine | Admitting: Family Medicine

## 2022-04-17 DIAGNOSIS — O3680X0 Pregnancy with inconclusive fetal viability, not applicable or unspecified: Secondary | ICD-10-CM

## 2022-04-17 DIAGNOSIS — O26891 Other specified pregnancy related conditions, first trimester: Secondary | ICD-10-CM | POA: Diagnosis not present

## 2022-04-17 DIAGNOSIS — R109 Unspecified abdominal pain: Secondary | ICD-10-CM | POA: Insufficient documentation

## 2022-04-17 DIAGNOSIS — Z3A01 Less than 8 weeks gestation of pregnancy: Secondary | ICD-10-CM | POA: Insufficient documentation

## 2022-04-17 DIAGNOSIS — O209 Hemorrhage in early pregnancy, unspecified: Secondary | ICD-10-CM | POA: Insufficient documentation

## 2022-04-17 DIAGNOSIS — R102 Pelvic and perineal pain: Secondary | ICD-10-CM | POA: Diagnosis not present

## 2022-04-17 DIAGNOSIS — O26851 Spotting complicating pregnancy, first trimester: Secondary | ICD-10-CM | POA: Diagnosis not present

## 2022-04-17 LAB — POCT PREGNANCY, URINE: Preg Test, Ur: POSITIVE — AB

## 2022-04-17 LAB — CBC
HCT: 38 % (ref 36.0–46.0)
Hemoglobin: 13 g/dL (ref 12.0–15.0)
MCH: 32.5 pg (ref 26.0–34.0)
MCHC: 34.2 g/dL (ref 30.0–36.0)
MCV: 95 fL (ref 80.0–100.0)
Platelets: 231 10*3/uL (ref 150–400)
RBC: 4 MIL/uL (ref 3.87–5.11)
RDW: 12.1 % (ref 11.5–15.5)
WBC: 5.6 10*3/uL (ref 4.0–10.5)
nRBC: 0 % (ref 0.0–0.2)

## 2022-04-17 LAB — HCG, QUANTITATIVE, PREGNANCY: hCG, Beta Chain, Quant, S: 41 m[IU]/mL — ABNORMAL HIGH (ref <5)

## 2022-04-17 LAB — URINALYSIS, ROUTINE W REFLEX MICROSCOPIC
Bilirubin Urine: NEGATIVE
Glucose, UA: NEGATIVE mg/dL
Ketones, ur: NEGATIVE mg/dL
Leukocytes,Ua: NEGATIVE
Nitrite: NEGATIVE
Protein, ur: NEGATIVE mg/dL
Specific Gravity, Urine: 1.028 (ref 1.005–1.030)
pH: 5 (ref 5.0–8.0)

## 2022-04-17 LAB — WET PREP, GENITAL
Sperm: NONE SEEN
Trich, Wet Prep: NONE SEEN
WBC, Wet Prep HPF POC: <10 (ref <10)
Yeast Wet Prep HPF POC: NONE SEEN

## 2022-04-17 MED ORDER — PREPLUS 27-1 MG PO TABS: 1.0000 | ORAL_TABLET | Freq: Every day | ORAL | 13 refills | Status: AC

## 2022-04-17 NOTE — MAU Provider Note (Signed)
History     CSN: [id number]  Arrival date and time: 04/17/22 1814   Event Date/Time   First Provider Initiated Contact with Patient 04/17/22 2041      Chief Complaint  Patient presents with   Abdominal Pain   Vaginal Bleeding   Possible Pregnancy   HPI Tiffany Dennis is a 21 y.o. G2P0010 at [redacted]w[redacted]d who presents to MAU with chief complaint of abdominal pain and vaginal bleeding in the setting of positive home pregnancy test. Patient's bleeding started 04/07/2022 and she attributed it to an abnormal period which was "light like the last day of my period". She is not saturating pads and sometimes doesn't see anything on her pad.  Patient's pain is suprapubic and LLQ. She describes it as "crampy" with pain score 4/10. She denies abdominal tenderness, fever or recent illness.   OB History     Gravida  2   Para      Term      Preterm      AB  1   Living         SAB  1   IAB      Ectopic      Multiple      Live Births              Past Medical History:  Diagnosis Date   Allergy    Intrinsic eczema     Past Surgical History:  Procedure Laterality Date   INGUINAL HERNIA REPAIR      Family History  Problem Relation Age of Onset   Allergic rhinitis Mother    Diabetes Father    Hypertension Father    Diabetes Maternal Grandmother    Diabetes Paternal Grandfather     Social History   Tobacco Use   Smoking status: Never    Passive exposure: Never   Smokeless tobacco: Never  Vaping Use   Vaping Use: Every day  Substance Use Topics   Alcohol use: No   Drug use: No    Allergies:  Allergies  Allergen Reactions   Lindane Swelling    Medications Prior to Admission  Medication Sig Dispense Refill Last Dose   gabapentin (NEURONTIN) 300 MG capsule Take 1 capsule (300 mg total) by mouth at bedtime. 30 capsule 0     Review of Systems  Gastrointestinal:  Positive for abdominal pain.  Genitourinary:  Positive for vaginal bleeding.  All other  systems reviewed and are negative.  Physical Exam   Blood pressure 106/74, pulse 77, temperature 98.9 F (37.2 C), temperature source Oral, resp. rate 16, height 5\' 7"  (1.702 m), weight 87.8 kg, last menstrual period 03/10/2022, SpO2 100 %.  Physical Exam Vitals and nursing note reviewed. Exam conducted with a chaperone present.  Constitutional:      Appearance: She is well-developed.  Cardiovascular:     Rate and Rhythm: Normal rate and regular rhythm.     Heart sounds: Normal heart sounds.  Pulmonary:     Effort: Pulmonary effort is normal.     Breath sounds: Normal breath sounds.  Abdominal:     General: Abdomen is flat.     Palpations: Abdomen is soft.     Tenderness: There is no abdominal tenderness.  Neurological:     Mental Status: She is alert.     MAU Course  Procedures  MDM  --Clue cells on wet prep without other Amsel Criteria. Treatment for Bacterial Vaginosis not indicated  Orders Placed This Encounter  Procedures  Wet prep, genital   US OB LESS THAN 14 WEEKS WITH OB TRANSVAGINAL   Urinalysis, Routine w reflex microscopic Urine, Clean Catch   CBC   hCG, quantitative, pregnancy   Nursing communication   Pregnancy, urine POC   Discharge patient   Patient Vitals for the past 24 hrs:  BP Temp Temp src Pulse Resp SpO2 Height Weight  04/17/22 1933 106/74 98.9 F (37.2 C) Oral 77 16 100 % 5\' 7"  (1.702 m) 87.8 kg   Results for orders placed or performed during the hospital encounter of 04/17/22 (from the past 24 hour(s))  Pregnancy, urine POC     Status: Abnormal   Collection Time: 04/17/22  6:46 PM  Result Value Ref Range   Preg Test, Ur POSITIVE (A) NEGATIVE  Urinalysis, Routine w reflex microscopic Urine, Clean Catch     Status: Abnormal   Collection Time: 04/17/22  7:27 PM  Result Value Ref Range   Color, Urine YELLOW YELLOW   APPearance HAZY (A) CLEAR   Specific Gravity, Urine 1.028 1.005 - 1.030   pH 5.0 5.0 - 8.0   Glucose, UA NEGATIVE  NEGATIVE mg/dL   Hgb urine dipstick MODERATE (A) NEGATIVE   Bilirubin Urine NEGATIVE NEGATIVE   Ketones, ur NEGATIVE NEGATIVE mg/dL   Protein, ur NEGATIVE NEGATIVE mg/dL   Nitrite NEGATIVE NEGATIVE   Leukocytes,Ua NEGATIVE NEGATIVE   RBC / HPF 0-5 0 - 5 RBC/hpf   WBC, UA 0-5 0 - 5 WBC/hpf   Bacteria, UA RARE (A) NONE SEEN   Squamous Epithelial / LPF 6-10 0 - 5   Mucus PRESENT   CBC     Status: None   Collection Time: 04/17/22  7:35 PM  Result Value Ref Range   WBC 5.6 4.0 - 10.5 K/uL   RBC 4.00 3.87 - 5.11 MIL/uL   Hemoglobin 13.0 12.0 - 15.0 g/dL   HCT 04/19/22 17.6 - 16.0 %   MCV 95.0 80.0 - 100.0 fL   MCH 32.5 26.0 - 34.0 pg   MCHC 34.2 30.0 - 36.0 g/dL   RDW 73.7 10.6 - 26.9 %   Platelets 231 150 - 400 K/uL   nRBC 0.0 0.0 - 0.2 %  hCG, quantitative, pregnancy     Status: Abnormal   Collection Time: 04/17/22  7:35 PM  Result Value Ref Range   hCG, Beta Chain, Quant, S 41 (H) <5 mIU/mL  Wet prep, genital     Status: Abnormal   Collection Time: 04/17/22  7:40 PM   Specimen: Vaginal  Result Value Ref Range   Yeast Wet Prep HPF POC NONE SEEN NONE SEEN   Trich, Wet Prep NONE SEEN NONE SEEN   Clue Cells Wet Prep HPF POC PRESENT (A) NONE SEEN   WBC, Wet Prep HPF POC <10 <10   Sperm NONE SEEN    04/19/22 OB LESS THAN 14 WEEKS WITH OB TRANSVAGINAL  Result Date: 04/17/2022 CLINICAL DATA:  Estimated gestational age by last menstrual period equals 5 weeks 3 days. Bleeding and spotting for 10 days EXAM: OBSTETRIC <14 WK 04/19/2022 AND TRANSVAGINAL OB US TECHNIQUE: Both transabdominal and transvaginal ultrasound examinations were performed for complete evaluation of the gestation as well as the maternal uterus, adnexal regions, and pelvic cul-de-sac. Transvaginal technique was performed to assess early pregnancy. COMPARISON:  None Available. FINDINGS: Intrauterine gestational sac: Not identified Yolk sac:  Not identified Embryo:  Not identified Subchorionic hemorrhage:  None visualized. Maternal  uterus/adnexae: Ovaries are normal volume. Adnexal mass hyperemia. No  fluid. Potential corpus luteal cyst of the RIGHT ovary. IMPRESSION: No intrauterine gestational sac, yolk sac, or fetal pole identified. Differential considerations include intrauterine pregnancy too early to be sonographically visualized, missed abortion, or ectopic pregnancy. Followup ultrasound is recommended in 10-14 days for further evaluation. Electronically Signed   By: Genevive Bi M.D.   On: 04/17/2022 20:30    Assessment and Plan  --21 y.o. G2P0010 with PUL --Quant hCG 41 --Blood type O POS --Discharge home in stable condition with ectopic precautions  F/U: --Appt made for repeat stat Quant hCG Thursday morning at [facility], CNM 04/17/2022, 9:37 PM

## 2022-04-17 NOTE — MAU Note (Signed)
Tiffany Dennis is a 21 y.o.  here in MAU reporting: started bleeding on Friday 6/9- thought it was her period, lasted longer than her usual period. stopped for a complete day, then resumed the next day and she started passing clots.  (Had a SAB last year).  +HPT yesterday. Having some cramping in LLQ LMP: 5/12 Onset of complaint: 6/9- bleeding started Pain score: 4 Vitals:   04/17/22 1933  BP: 106/74  Pulse: 77  Resp: 16  Temp: 98.9 F (37.2 C)  SpO2: 100%     Lab orders placed from triage:

## 2022-04-18 LAB — GC/CHLAMYDIA PROBE AMP (~~LOC~~) NOT AT ARMC
Chlamydia: NEGATIVE
Comment: NEGATIVE
Comment: NORMAL
Neisseria Gonorrhea: NEGATIVE

## 2022-04-20 ENCOUNTER — Other Ambulatory Visit: Payer: Self-pay

## 2022-04-20 ENCOUNTER — Ambulatory Visit: Payer: BC Managed Care – PPO

## 2022-04-20 VITALS — BP 110/67 | HR 67 | Wt 193.2 lb

## 2022-04-20 DIAGNOSIS — O3680X0 Pregnancy with inconclusive fetal viability, not applicable or unspecified: Secondary | ICD-10-CM | POA: Diagnosis not present

## 2022-04-20 LAB — BETA HCG QUANT (REF LAB): hCG Quant: 9 m[IU]/mL

## 2022-04-20 NOTE — Progress Notes (Signed)
Pt here today for STAT Beta for pregnancy of unknown location.  Pt wears a panty liner or when she wipes she sees pink tinge on tissue.  Pt denies pain.  Pt explained that we call with results and f/u around noon.  Pt verbalized understanding with no further questions.   Received results of beta at 9.  Crissie Reese, MD reviewed results and stated that pt has miscarried and recommends that pt f/u in one week for non stat beta trend levels to zero.  Notified pt results and providers recommendation.  Pt states that she will be able to come in on 04/27/22 @ 0930.  Pt verbalized understanding to all that was discussed and did not have any other questions.   Addison Naegeli, RN  04/20/22

## 2022-04-24 ENCOUNTER — Other Ambulatory Visit: Payer: Self-pay | Admitting: *Deleted

## 2022-04-24 DIAGNOSIS — O039 Complete or unspecified spontaneous abortion without complication: Secondary | ICD-10-CM

## 2022-04-27 ENCOUNTER — Other Ambulatory Visit: Payer: BC Managed Care – PPO

## 2022-04-27 DIAGNOSIS — O039 Complete or unspecified spontaneous abortion without complication: Secondary | ICD-10-CM

## 2022-04-28 LAB — BETA HCG QUANT (REF LAB): hCG Quant: <1 m[IU]/mL

## 2022-07-07 DIAGNOSIS — Z01419 Encounter for gynecological examination (general) (routine) without abnormal findings: Secondary | ICD-10-CM | POA: Diagnosis not present

## 2022-07-07 DIAGNOSIS — Z124 Encounter for screening for malignant neoplasm of cervix: Secondary | ICD-10-CM | POA: Diagnosis not present

## 2022-07-07 DIAGNOSIS — Z6829 Body mass index (BMI) 29.0-29.9, adult: Secondary | ICD-10-CM | POA: Diagnosis not present

## 2022-07-07 DIAGNOSIS — Z113 Encounter for screening for infections with a predominantly sexual mode of transmission: Secondary | ICD-10-CM | POA: Diagnosis not present

## 2023-01-15 DIAGNOSIS — S01311A Laceration without foreign body of right ear, initial encounter: Secondary | ICD-10-CM | POA: Diagnosis not present

## 2023-05-12 ENCOUNTER — Ambulatory Visit
Admission: RE | Admit: 2023-05-12 | Discharge: 2023-05-12 | Disposition: A | Discharge status: Home / Self Care | Payer: BC Managed Care – PPO | Source: Ambulatory Visit | Attending: Internal Medicine | Admitting: Internal Medicine

## 2023-05-12 VITALS — BP 101/68 | HR 68 | Temp 98.3°F | Resp 20

## 2023-05-12 DIAGNOSIS — R21 Rash and other nonspecific skin eruption: Secondary | ICD-10-CM | POA: Diagnosis not present

## 2023-05-12 MED ORDER — HYDROCORTISONE (PERIANAL) 2.5 % EX CREA
1.0000 | TOPICAL_CREAM | Freq: Two times a day (BID) | CUTANEOUS | 0 refills | Status: AC
Start: 2023-05-12 — End: ?

## 2023-05-12 NOTE — ED Triage Notes (Signed)
Pt states rash to buttocks for the past 4 days.  States she has been putting Aquaphor on it.

## 2023-05-12 NOTE — ED Provider Notes (Signed)
[facility]    CSN: [id number] Arrival date & time: 05/12/23  0955      History   Chief Complaint Chief Complaint  Patient presents with   Rash    Buttock area after using different brand of toilet tissue 4-5 days ago. Rash getting worse. - Entered by patient    HPI Tiffany Dennis is a 22 y.o. female.   Patient presents with irritated rash to buttocks that started about 4 days ago.  Patient reports rash started after she used a new toilet paper.  Has been applying Aquaphor with minimal improvement.   Rash   Past Medical History:  Diagnosis Date   Allergy    Intrinsic eczema     Patient Active Problem List   Diagnosis Date Noted   Generalized anxiety disorder 09/30/2019   Intrinsic eczema 05/29/2017   Instability of left shoulder joint 03/13/2017   Frequent headaches 10/26/2015    Past Surgical History:  Procedure Laterality Date   INGUINAL HERNIA REPAIR      OB History     Gravida  2   Para      Term      Preterm      AB  1   Living         SAB  1   IAB      Ectopic      Multiple      Live Births               Home Medications    Prior to Admission medications   Medication Sig Start Date End Date Taking? Authorizing Provider  hydrocortisone (PROCTOSOL HC) 2.5 % rectal cream Place 1 Application rectally 2 (two) times daily. Do not apply close or on vaginal area. 05/12/23  Yes Edi Gorniak, Acie Fredrickson, FNP  Prenatal Vit-Fe Fumarate-FA (PREPLUS) 27-1 MG TABS Take 1 tablet by mouth daily. Patient not taking: Reported on 04/20/2022 04/17/22   Calvert Cantor, CNM    Family History Family History  Problem Relation Age of Onset   Allergic rhinitis Mother    Diabetes Father    Hypertension Father    Diabetes Maternal Grandmother    Diabetes Paternal Grandfather     Social History Social History   Tobacco Use   Smoking status: Never    Passive exposure: Never   Smokeless tobacco: Never  Vaping Use   Vaping status:  Every Day  Substance Use Topics   Alcohol use: No   Drug use: No     Allergies   Lindane   Review of Systems Review of Systems Per HPI  Physical Exam Triage Vital Signs ED Triage Vitals  Encounter Vitals Group     BP 05/12/23 1014 101/68     Systolic BP Percentile --      Diastolic BP Percentile --      Pulse Rate 05/12/23 1014 68     Resp 05/12/23 1014 20     Temp 05/12/23 1014 98.3 F (36.8 C)     Temp Source 05/12/23 1014 Oral     SpO2 05/12/23 1014 97 %     Weight --      Height --      Head Circumference --      Peak Flow --      Pain Score 05/12/23 1019 0     Pain Loc --      Pain Education --      Exclude from Growth Chart --  No data found.  Updated Vital Signs BP 101/68 (BP Location: Left Arm)   Pulse 68   Temp 98.3 F (36.8 C) (Oral)   Resp 20   LMP 05/09/2023 (Exact Date)   SpO2 97%   Visual Acuity Right Eye Distance:   Left Eye Distance:   Bilateral Distance:    Right Eye Near:   Left Eye Near:    Bilateral Near:     Physical Exam Exam conducted with a chaperone present.  Constitutional:      General: She is not in acute distress.    Appearance: Normal appearance. She is not toxic-appearing or diaphoretic.  HENT:     Head: Normocephalic and atraumatic.  Eyes:     Extraocular Movements: Extraocular movements intact.     Conjunctiva/sclera: Conjunctivae normal.  Pulmonary:     Effort: Pulmonary effort is normal.  Genitourinary:    Comments: Patient has excoriation and maculopapular rash present throughout intergluteal cleft that extends from outside of rectum throughout intergluteal cleft.  It does not extend to vaginal area and is not notable on rectum. Neurological:     General: No focal deficit present.     Mental Status: She is alert and oriented to person, place, and time. Mental status is at baseline.  Psychiatric:        Mood and Affect: Mood normal.        Behavior: Behavior normal.        Thought Content: Thought  content normal.        Judgment: Judgment normal.      UC Treatments / Results  Labs (all labs ordered are listed, but only abnormal results are displayed) Labs Reviewed - No data to display  EKG   Radiology No results found.  Procedures Procedures (including critical care time)  Medications Ordered in UC Medications - No data to display  Initial Impression / Assessment and Plan / UC Course  I have reviewed the triage vital signs and the nursing notes.  Pertinent labs & imaging results that were available during my care of the patient were reviewed by me and considered in my medical decision making (see chart for details).     It appears that patient is having skin irritation from new toilet paper. Advised to avoid this toilet paper. Will prescribe hydrocortisone cream topically to apply to area.  Advised patient to not apply around or on vaginal area.  Advised follow-up if symptoms persist or worsen.  Patient verbalized understanding and was agreeable with plan. Final Clinical Impressions(s) / UC Diagnoses   Final diagnoses:  Rash and nonspecific skin eruption     Discharge Instructions      I have prescribed you a topical cream.  Do not apply on or around vaginal area.  Follow-up if any symptoms persist or worsen.    ED Prescriptions     Medication Sig Dispense Auth. Provider   hydrocortisone (PROCTOSOL HC) 2.5 % rectal cream Place 1 Application rectally 2 (two) times daily. Do not apply close or on vaginal area. 30 g Gustavus Bryant, [place]      PDMP not reviewed this encounter.   Gustavus Bryant, [place] 05/12/23 [phone]

## 2023-05-12 NOTE — Discharge Instructions (Signed)
I have prescribed you a topical cream.  Do not apply on or around vaginal area.  Follow-up if any symptoms persist or worsen.

## 2023-05-16 ENCOUNTER — Other Ambulatory Visit (HOSPITAL_COMMUNITY)
Admission: RE | Admit: 2023-05-16 | Discharge: 2023-05-16 | Disposition: A | Discharge status: Home / Self Care | Payer: BC Managed Care – PPO | Source: Ambulatory Visit | Attending: Family | Admitting: Family

## 2023-05-16 ENCOUNTER — Ambulatory Visit (INDEPENDENT_AMBULATORY_CARE_PROVIDER_SITE_OTHER): Payer: BC Managed Care – PPO | Admitting: Family

## 2023-05-16 VITALS — BP 98/67 | HR 81 | Temp 98.3°F | Ht 66.5 in | Wt 194.4 lb

## 2023-05-16 DIAGNOSIS — N760 Acute vaginitis: Secondary | ICD-10-CM | POA: Insufficient documentation

## 2023-05-16 DIAGNOSIS — N898 Other specified noninflammatory disorders of vagina: Secondary | ICD-10-CM

## 2023-05-16 DIAGNOSIS — B9689 Other specified bacterial agents as the cause of diseases classified elsewhere: Secondary | ICD-10-CM | POA: Insufficient documentation

## 2023-05-16 DIAGNOSIS — B3731 Acute candidiasis of vulva and vagina: Secondary | ICD-10-CM | POA: Insufficient documentation

## 2023-05-16 LAB — POCT URINALYSIS DIP (CLINITEK)
Bilirubin, UA: NEGATIVE
Glucose, UA: NEGATIVE mg/dL
Ketones, POC UA: NEGATIVE mg/dL
Nitrite, UA: NEGATIVE
POC PROTEIN,UA: NEGATIVE
Spec Grav, UA: 1.025 (ref 1.010–1.025)
Urobilinogen, UA: 1 E.U./dL
pH, UA: 7 (ref 5.0–8.0)

## 2023-05-16 NOTE — Progress Notes (Signed)
Patient ID: Tiffany Dennis, female    DOB: [birth year]  MRN: [id number]  CC: Vaginal Discharge  Subjective: Tiffany Dennis is a 22 y.o. female who presents for vaginal discharge.   Her concerns today include:  - Reports vaginal discharge with mild odor. She denies recent exposure/additional symptoms.  - Reports she was recently seen at Urgent Care for irritation to buttocks. She was prescribed Hydrocortisone which improved symptoms. - No further issues/concerns for discussion today.  Patient Active Problem List   Diagnosis Date Noted   Generalized anxiety disorder 09/30/2019   Intrinsic eczema 05/29/2017   Instability of left shoulder joint 03/13/2017   Frequent headaches 10/26/2015     Current Outpatient Medications on File Prior to Visit  Medication Sig Dispense Refill   hydrocortisone (PROCTOSOL HC) 2.5 % rectal cream Place 1 Application rectally 2 (two) times daily. Do not apply close or on vaginal area. 30 g 0   Prenatal Vit-Fe Fumarate-FA (PREPLUS) 27-1 MG TABS Take 1 tablet by mouth daily. (Patient not taking: Reported on 04/20/2022) 30 tablet 13   No current facility-administered medications on file prior to visit.    Allergies  Allergen Reactions   Lindane Swelling    Social History   Socioeconomic History   Marital status: Single    Spouse name: Not on file   Number of children: 0   Years of education: Not on file   Highest education level: Some college, no degree  Occupational History   Not on file  Tobacco Use   Smoking status: Never    Passive exposure: Never   Smokeless tobacco: Never  Vaping Use   Vaping status: Every Day  Substance and Sexual Activity   Alcohol use: No   Drug use: No   Sexual activity: Yes    Birth control/protection: None  Other Topics Concern   Not on file  Social History Narrative   Not on file   Social Determinants of Health   Financial Resource Strain: Low Risk  (05/16/2023)   Overall Financial Resource Strain (CARDIA)     Difficulty of Paying Living Expenses: Not hard at all  Food Insecurity: Food Insecurity Present (05/16/2023)   Hunger Vital Sign    Worried About Running Out of Food in the Last Year: Sometimes true    Ran Out of Food in the Last Year: Sometimes true  Transportation Needs: No Transportation Needs (05/16/2023)   PRAPARE - Administrator, Civil Service (Medical): No    Lack of Transportation (Non-Medical): No  Physical Activity: Sufficiently Active (05/16/2023)   Exercise Vital Sign    Days of Exercise per Week: 5 days    Minutes of Exercise per Session: 120 min  Stress: No Stress Concern Present (05/16/2023)   [employer] of Occupational Health - Occupational Stress Questionnaire    Feeling of Stress : Only a little  Social Connections: Moderately Integrated (05/16/2023)   Social Connection and Isolation Panel [NHANES]    Frequency of Communication with Friends and Family: More than three times a week    Frequency of Social Gatherings with Friends and Family: Twice a week    Attends Religious Services: More than 4 times per year    Active Member of [employer] or Organizations: No    Attends Engineer, structural: Not on file    Marital Status: Living with partner  Intimate Partner Violence: Not on file    Family History  Problem Relation Age of Onset   Allergic rhinitis  Mother    Diabetes Father    Hypertension Father    Diabetes Maternal Grandmother    Diabetes Paternal Grandfather     Past Surgical History:  Procedure Laterality Date   INGUINAL HERNIA REPAIR      ROS: Review of Systems Negative except as stated above  PHYSICAL EXAM: BP 98/67   Pulse 81   Temp 98.3 F (36.8 C)   Ht 5' 6.5" (1.689 m)   Wt 194 lb 6.4 oz (88.2 kg)   LMP 05/09/2023 (Exact Date)   SpO2 95%   BMI 30.91 kg/m   Physical Exam HENT:     Head: Normocephalic and atraumatic.     Nose: Nose normal.     Mouth/Throat:     Mouth: Mucous membranes are moist.      Pharynx: Oropharynx is clear.  Eyes:     Extraocular Movements: Extraocular movements intact.     Conjunctiva/sclera: Conjunctivae normal.     Pupils: Pupils are equal, round, and reactive to light.  Cardiovascular:     Rate and Rhythm: Normal rate and regular rhythm.     Pulses: Normal pulses.     Heart sounds: Normal heart sounds.  Pulmonary:     Effort: Pulmonary effort is normal.     Breath sounds: Normal breath sounds.  Genitourinary:    General: Normal vulva.     Comments: Sabino Snipes, CMA present.  Musculoskeletal:        General: Normal range of motion.     Cervical back: Normal range of motion and neck supple.  Neurological:     General: No focal deficit present.     Mental Status: She is alert and oriented to person, place, and time.  Psychiatric:        Mood and Affect: Mood normal.        Behavior: Behavior normal.     ASSESSMENT AND PLAN: 1. Vaginal discharge - Routine screening.  - Cervicovaginal ancillary only - POCT URINALYSIS DIP (CLINITEK); Future   Patient was given the opportunity to ask questions.  Patient verbalized understanding of the plan and was able to repeat key elements of the plan. Patient was given clear instructions to go to Emergency Department or return to medical center if symptoms don't improve, worsen, or new problems develop.The patient verbalized understanding.   Orders Placed This Encounter  Procedures   POCT URINALYSIS DIP (CLINITEK)    Follow-up with primary provider as scheduled.   Rema Fendt, NP

## 2023-05-17 LAB — CERVICOVAGINAL ANCILLARY ONLY
Bacterial Vaginitis (gardnerella): POSITIVE — AB
Candida Glabrata: NEGATIVE
Candida Vaginitis: POSITIVE — AB
Chlamydia: NEGATIVE
Comment: NEGATIVE
Comment: NEGATIVE
Comment: NEGATIVE
Comment: NEGATIVE
Comment: NEGATIVE
Comment: NORMAL
Neisseria Gonorrhea: NEGATIVE
Trichomonas: NEGATIVE

## 2023-05-18 ENCOUNTER — Other Ambulatory Visit: Payer: Self-pay | Admitting: Family Medicine

## 2023-05-18 MED ORDER — METRONIDAZOLE 500 MG PO TABS: 500.0000 mg | ORAL_TABLET | Freq: Two times a day (BID) | ORAL | 0 refills | Status: AC

## 2023-05-18 MED ORDER — FLUCONAZOLE 150 MG PO TABS: 150.0000 mg | ORAL_TABLET | Freq: Once | ORAL | 0 refills | Status: AC

## 2023-05-21 ENCOUNTER — Encounter: Payer: Self-pay | Admitting: *Deleted

## 2023-07-05 ENCOUNTER — Other Ambulatory Visit (HOSPITAL_COMMUNITY)
Admission: RE | Admit: 2023-07-05 | Discharge: 2023-07-05 | Disposition: A | Discharge status: Home / Self Care | Payer: BC Managed Care – PPO | Source: Ambulatory Visit | Attending: Family | Admitting: Family

## 2023-07-05 ENCOUNTER — Ambulatory Visit (INDEPENDENT_AMBULATORY_CARE_PROVIDER_SITE_OTHER): Payer: BC Managed Care – PPO | Admitting: Family

## 2023-07-05 ENCOUNTER — Encounter: Payer: Self-pay | Admitting: Family

## 2023-07-05 VITALS — BP 106/73 | HR 68 | Temp 98.2°F | Ht 67.0 in | Wt 191.2 lb

## 2023-07-05 DIAGNOSIS — Z124 Encounter for screening for malignant neoplasm of cervix: Secondary | ICD-10-CM | POA: Diagnosis not present

## 2023-07-05 DIAGNOSIS — R87610 Atypical squamous cells of undetermined significance on cytologic smear of cervix (ASC-US): Secondary | ICD-10-CM | POA: Insufficient documentation

## 2023-07-05 DIAGNOSIS — Z131 Encounter for screening for diabetes mellitus: Secondary | ICD-10-CM | POA: Diagnosis not present

## 2023-07-05 DIAGNOSIS — Z0000 Encounter for general adult medical examination without abnormal findings: Secondary | ICD-10-CM

## 2023-07-05 DIAGNOSIS — Z114 Encounter for screening for human immunodeficiency virus [HIV]: Secondary | ICD-10-CM

## 2023-07-05 DIAGNOSIS — Z1329 Encounter for screening for other suspected endocrine disorder: Secondary | ICD-10-CM

## 2023-07-05 DIAGNOSIS — Z13228 Encounter for screening for other metabolic disorders: Secondary | ICD-10-CM | POA: Diagnosis not present

## 2023-07-05 DIAGNOSIS — Z113 Encounter for screening for infections with a predominantly sexual mode of transmission: Secondary | ICD-10-CM | POA: Diagnosis not present

## 2023-07-05 DIAGNOSIS — Z130 Encounter for screening for diseases of the blood and blood-forming organs and certain disorders involving the immune mechanism: Secondary | ICD-10-CM | POA: Diagnosis not present

## 2023-07-05 DIAGNOSIS — Z13220 Encounter for screening for lipoid disorders: Secondary | ICD-10-CM

## 2023-07-05 NOTE — Progress Notes (Signed)
Patient ID: Tiffany Dennis, female    DOB: 11-28-2000  MRN: [id number]  CC: Annual Physical Exam  Subjective: Tiffany Dennis is a 22 y.o. female who presents for annual physical exam.   Her concerns today include:  None.   Patient Active Problem List   Diagnosis Date Noted   Generalized anxiety disorder 09/30/2019   Intrinsic eczema 05/29/2017   Instability of left shoulder joint 03/13/2017   Frequent headaches 10/26/2015     Current Outpatient Medications on File Prior to Visit  Medication Sig Dispense Refill   hydrocortisone (PROCTOSOL HC) 2.5 % rectal cream Place 1 Application rectally 2 (two) times daily. Do not apply close or on vaginal area. (Patient not taking: Reported on 07/05/2023) 30 g 0   Prenatal Vit-Fe Fumarate-FA (PREPLUS) 27-1 MG TABS Take 1 tablet by mouth daily. (Patient not taking: Reported on 04/20/2022) 30 tablet 13   No current facility-administered medications on file prior to visit.    Allergies  Allergen Reactions   Lindane Swelling    Social History   Socioeconomic History   Marital status: Single    Spouse name: Not on file   Number of children: 0   Years of education: Not on file   Highest education level: Some college, no degree  Occupational History   Not on file  Tobacco Use   Smoking status: Never    Passive exposure: Never   Smokeless tobacco: Never  Vaping Use   Vaping status: Every Day  Substance and Sexual Activity   Alcohol use: No   Drug use: No   Sexual activity: Yes    Birth control/protection: None  Other Topics Concern   Not on file  Social History Narrative   Not on file   Social Determinants of Health   Financial Resource Strain: Low Risk  (05/16/2023)   Overall Financial Resource Strain (CARDIA)    Difficulty of Paying Living Expenses: Not hard at all  Food Insecurity: Food Insecurity Present (05/16/2023)   Hunger Vital Sign    Worried About Running Out of Food in the Last Year: Sometimes true    Ran Out of Food  in the Last Year: Sometimes true  Transportation Needs: No Transportation Needs (05/16/2023)   PRAPARE - Administrator, Civil Service (Medical): No    Lack of Transportation (Non-Medical): No  Physical Activity: Sufficiently Active (05/16/2023)   Exercise Vital Sign    Days of Exercise per Week: 5 days    Minutes of Exercise per Session: 120 min  Stress: No Stress Concern Present (05/16/2023)   [employer] of Occupational Health - Occupational Stress Questionnaire    Feeling of Stress : Only a little  Social Connections: Moderately Integrated (05/16/2023)   Social Connection and Isolation Panel [NHANES]    Frequency of Communication with Friends and Family: More than three times a week    Frequency of Social Gatherings with Friends and Family: Twice a week    Attends Religious Services: More than 4 times per year    Active Member of [employer] or Organizations: No    Attends Engineer, structural: Not on file    Marital Status: Living with partner  Intimate Partner Violence: Not on file    Family History  Problem Relation Age of Onset   Allergic rhinitis Mother    Diabetes Father    Hypertension Father    Diabetes Maternal Grandmother    Diabetes Paternal Grandfather     Past Surgical History:  Procedure Laterality Date   INGUINAL HERNIA REPAIR      ROS: Review of Systems Negative except as stated above  PHYSICAL EXAM: BP 106/73   Pulse 68   Temp 98.2 F (36.8 C) (Oral)   Ht 5\' 7"  (1.702 m)   Wt 191 lb 3.2 oz (86.7 kg)   LMP 06/26/2023   SpO2 98%   BMI 29.95 kg/m   Physical Exam HENT:     Head: Normocephalic and atraumatic.     Right Ear: Tympanic membrane, ear canal and external ear normal.     Left Ear: Tympanic membrane, ear canal and external ear normal.     Nose: Nose normal.     Mouth/Throat:     Mouth: Mucous membranes are moist.     Pharynx: Oropharynx is clear.  Eyes:     Extraocular Movements: Extraocular movements intact.      Conjunctiva/sclera: Conjunctivae normal.     Pupils: Pupils are equal, round, and reactive to light.  Neck:     Thyroid: No thyroid mass, thyromegaly or thyroid tenderness.  Cardiovascular:     Rate and Rhythm: Normal rate and regular rhythm.     Pulses: Normal pulses.     Heart sounds: Normal heart sounds.  Pulmonary:     Effort: Pulmonary effort is normal.     Breath sounds: Normal breath sounds.  Chest:     Comments: Patient declined.  Abdominal:     General: Bowel sounds are normal.     Palpations: Abdomen is soft.  Genitourinary:    General: Normal vulva.     Vagina: Normal.     Cervix: Normal.     Uterus: Normal.      Adnexa: Right adnexa normal and left adnexa normal.     Comments: Tiffany Dennis, CMA present. Musculoskeletal:        General: Normal range of motion.     Right shoulder: Normal.     Left shoulder: Normal.     Right upper arm: Normal.     Left upper arm: Normal.     Right elbow: Normal.     Left elbow: Normal.     Right forearm: Normal.     Left forearm: Normal.     Right wrist: Normal.     Left wrist: Normal.     Right hand: Normal.     Left hand: Normal.     Cervical back: Normal, normal range of motion and neck supple.     Thoracic back: Normal.     Lumbar back: Normal.     Right hip: Normal.     Left hip: Normal.     Right upper leg: Normal.     Left upper leg: Normal.     Right knee: Normal.     Left knee: Normal.     Right lower leg: Normal.     Left lower leg: Normal.     Right ankle: Normal.     Left ankle: Normal.     Right foot: Normal.     Left foot: Normal.  Skin:    General: Skin is warm and dry.     Capillary Refill: Capillary refill takes less than 2 seconds.  Neurological:     General: No focal deficit present.     Mental Status: She is alert and oriented to person, place, and time.  Psychiatric:        Mood and Affect: Mood normal.        Behavior: Behavior normal.  ASSESSMENT AND PLAN: 1. Annual physical  exam - Counseled on 150 minutes of exercise per week as tolerated, healthy eating (including decreased daily intake of saturated fats, cholesterol, added sugars, sodium), STI prevention, and routine healthcare maintenance.  2. Screening for metabolic disorder - Routine screening.  - CMP14+EGFR  3. Screening for deficiency anemia - Routine screening.  - CBC  4. Diabetes mellitus screening - Routine screening.  - Hemoglobin A1c  5. Screening cholesterol level - Routine screening.  - Lipid panel  6. Thyroid disorder screen - Routine screening.  - TSH  7. Cervical cancer screening - Routine screening.  - Cytology - PAP([place])  8. Routine screening for STI (sexually transmitted infection) - Routine screening.  - Cervicovaginal ancillary only  9. Encounter for screening for HIV - Routine screening.  - HIV antibody (with reflex)   Patient was given the opportunity to ask questions.  Patient verbalized understanding of the plan and was able to repeat key elements of the plan. Patient was given clear instructions to go to Emergency Department or return to medical center if symptoms don't improve, worsen, or new problems develop.The patient verbalized understanding.   Orders Placed This Encounter  Procedures   CBC   Lipid panel   TSH   CMP14+EGFR   Hemoglobin A1c   HIV antibody (with reflex)     Return in about 1 year (around 07/04/2024) for Physical per patient preference.  Rema Fendt, NP

## 2023-07-05 NOTE — Patient Instructions (Signed)

## 2023-07-06 LAB — CERVICOVAGINAL ANCILLARY ONLY
Bacterial Vaginitis (gardnerella): NEGATIVE
Candida Glabrata: NEGATIVE
Candida Vaginitis: NEGATIVE
Chlamydia: NEGATIVE
Comment: NEGATIVE
Comment: NEGATIVE
Comment: NEGATIVE
Comment: NEGATIVE
Comment: NEGATIVE
Comment: NORMAL
Neisseria Gonorrhea: NEGATIVE
Trichomonas: NEGATIVE

## 2023-07-06 LAB — CMP14+EGFR
ALT: 28 IU/L (ref 0–32)
AST: 19 IU/L (ref 0–40)
Albumin: 4.1 g/dL (ref 4.0–5.0)
Alkaline Phosphatase: 62 IU/L (ref 44–121)
BUN/Creatinine Ratio: 13 (ref 9–23)
BUN: 9 mg/dL (ref 6–20)
Bilirubin Total: 0.4 mg/dL (ref 0.0–1.2)
CO2: 23 mmol/L (ref 20–29)
Calcium: 9.5 mg/dL (ref 8.7–10.2)
Chloride: 102 mmol/L (ref 96–106)
Creatinine, Ser: 0.72 mg/dL (ref 0.57–1.00)
Globulin, Total: 4 g/dL (ref 1.5–4.5)
Glucose: 79 mg/dL (ref 70–99)
Potassium: 4.1 mmol/L (ref 3.5–5.2)
Sodium: 139 mmol/L (ref 134–144)
Total Protein: 8.1 g/dL (ref 6.0–8.5)
eGFR: 121 mL/min/{1.73_m2} (ref >59)

## 2023-07-06 LAB — HIV ANTIBODY (ROUTINE TESTING W REFLEX): HIV Screen 4th Generation wRfx: NONREACTIVE

## 2023-07-06 LAB — CBC
Hematocrit: 43.6 % (ref 34.0–46.6)
Hemoglobin: 13.9 g/dL (ref 11.1–15.9)
MCH: 31.9 pg (ref 26.6–33.0)
MCHC: 31.9 g/dL (ref 31.5–35.7)
MCV: 100 fL — ABNORMAL HIGH (ref 79–97)
Platelets: 238 10*3/uL (ref 150–450)
RBC: 4.36 x10E6/uL (ref 3.77–5.28)
RDW: 11.7 % (ref 11.7–15.4)
WBC: 4 10*3/uL (ref 3.4–10.8)

## 2023-07-06 LAB — LIPID PANEL
Chol/HDL Ratio: 2.4 ratio (ref 0.0–4.4)
Cholesterol, Total: 150 mg/dL (ref 100–199)
HDL: 62 mg/dL (ref >39)
LDL Chol Calc (NIH): 77 mg/dL (ref 0–99)
Triglycerides: 48 mg/dL (ref 0–149)
VLDL Cholesterol Cal: 11 mg/dL (ref 5–40)

## 2023-07-06 LAB — HEMOGLOBIN A1C
Est. average glucose Bld gHb Est-mCnc: 108 mg/dL
Hgb A1c MFr Bld: 5.4 % (ref 4.8–5.6)

## 2023-07-06 LAB — TSH: TSH: 0.91 u[IU]/mL (ref 0.450–4.500)

## 2023-07-06 LAB — CYTOLOGY - PAP
Adequacy: ABSENT
Diagnosis: UNDETERMINED — AB

## 2023-07-09 ENCOUNTER — Other Ambulatory Visit: Payer: Self-pay | Admitting: Family

## 2023-07-09 DIAGNOSIS — R87610 Atypical squamous cells of undetermined significance on cytologic smear of cervix (ASC-US): Secondary | ICD-10-CM

## 2023-07-10 ENCOUNTER — Telehealth: Payer: Self-pay

## 2023-07-10 NOTE — Telephone Encounter (Signed)
Pt given lab results per notes of Amy on 07/09/23. Pt verbalized understanding.  Abnormal pap. Referral to Gynecology for further evaluation/management. Expect call soon with appointment details.  Written by Rema Fendt, NP on 07/09/2023  7:58 AM EDT View Past Comments Seen by patient Tiffany Dennis on 07/09/2023  5:47 PM

## 2023-09-05 DIAGNOSIS — M25571 Pain in right ankle and joints of right foot: Secondary | ICD-10-CM | POA: Diagnosis not present

## 2024-02-22 DIAGNOSIS — F4322 Adjustment disorder with anxiety: Secondary | ICD-10-CM | POA: Diagnosis not present

## 2024-03-07 DIAGNOSIS — F4322 Adjustment disorder with anxiety: Secondary | ICD-10-CM | POA: Diagnosis not present

## 2024-03-14 DIAGNOSIS — F4322 Adjustment disorder with anxiety: Secondary | ICD-10-CM | POA: Diagnosis not present

## 2024-03-21 DIAGNOSIS — F4322 Adjustment disorder with anxiety: Secondary | ICD-10-CM | POA: Diagnosis not present

## 2024-03-28 DIAGNOSIS — F4322 Adjustment disorder with anxiety: Secondary | ICD-10-CM | POA: Diagnosis not present

## 2024-04-04 ENCOUNTER — Ambulatory Visit: Admitting: Family

## 2024-04-04 DIAGNOSIS — F4322 Adjustment disorder with anxiety: Secondary | ICD-10-CM | POA: Diagnosis not present

## 2024-04-07 ENCOUNTER — Encounter: Admitting: Family

## 2024-04-07 ENCOUNTER — Encounter: Payer: Self-pay | Admitting: Family

## 2024-04-07 NOTE — Progress Notes (Signed)
 Erroneous encounter-disregard

## 2024-04-11 DIAGNOSIS — F4322 Adjustment disorder with anxiety: Secondary | ICD-10-CM | POA: Diagnosis not present

## 2024-04-15 DIAGNOSIS — F419 Anxiety disorder, unspecified: Secondary | ICD-10-CM | POA: Diagnosis not present

## 2024-04-25 DIAGNOSIS — F419 Anxiety disorder, unspecified: Secondary | ICD-10-CM | POA: Diagnosis not present

## 2024-04-29 DIAGNOSIS — F419 Anxiety disorder, unspecified: Secondary | ICD-10-CM | POA: Diagnosis not present

## 2024-05-09 DIAGNOSIS — F419 Anxiety disorder, unspecified: Secondary | ICD-10-CM | POA: Diagnosis not present

## 2024-05-16 DIAGNOSIS — F419 Anxiety disorder, unspecified: Secondary | ICD-10-CM | POA: Diagnosis not present

## 2024-05-23 DIAGNOSIS — F419 Anxiety disorder, unspecified: Secondary | ICD-10-CM | POA: Diagnosis not present

## 2024-05-30 DIAGNOSIS — F419 Anxiety disorder, unspecified: Secondary | ICD-10-CM | POA: Diagnosis not present

## 2024-06-06 ENCOUNTER — Ambulatory Visit (INDEPENDENT_AMBULATORY_CARE_PROVIDER_SITE_OTHER): Admitting: Family

## 2024-06-06 ENCOUNTER — Encounter: Payer: Self-pay | Admitting: Family

## 2024-06-06 VITALS — BP 106/73 | HR 83 | Temp 98.6°F | Resp 16 | Ht 66.0 in | Wt 205.0 lb

## 2024-06-06 DIAGNOSIS — R519 Headache, unspecified: Secondary | ICD-10-CM

## 2024-06-06 DIAGNOSIS — M79604 Pain in right leg: Secondary | ICD-10-CM | POA: Diagnosis not present

## 2024-06-06 DIAGNOSIS — F419 Anxiety disorder, unspecified: Secondary | ICD-10-CM | POA: Diagnosis not present

## 2024-06-06 DIAGNOSIS — M79671 Pain in right foot: Secondary | ICD-10-CM | POA: Diagnosis not present

## 2024-06-06 DIAGNOSIS — M79605 Pain in left leg: Secondary | ICD-10-CM | POA: Diagnosis not present

## 2024-06-06 DIAGNOSIS — M79672 Pain in left foot: Secondary | ICD-10-CM

## 2024-06-06 MED ORDER — SUMATRIPTAN SUCCINATE 25 MG PO TABS: ORAL_TABLET | ORAL | 1 refills | Status: DC

## 2024-06-06 NOTE — Progress Notes (Signed)
 Patient is having headaches and dizziest and sharp pain in her feet.  Shar pains in her feet having been going on for 3weeks

## 2024-06-06 NOTE — Progress Notes (Signed)
 Patient ID: MEGA KINKADE, female    DOB: 2001/09/08  MRN: [id number]  CC: Headaches  Subjective: Tiffany Dennis is a 23 y.o. female who presents for headaches.  Her concerns today include:  - Intermittent headaches. Denies red flag symptoms. Taking over-the-counter medications with minimal relief.  - Bilateral lower leg pain for several weeks especially during daytime. - Bilateral feet pain especially during nighttime.  Patient Active Problem List   Diagnosis Date Noted   Generalized anxiety disorder 09/30/2019   Intrinsic eczema 05/29/2017   Instability of left shoulder joint 03/13/2017   Frequent headaches 10/26/2015     Current Outpatient Medications on File Prior to Visit  Medication Sig Dispense Refill   hydrocortisone  (PROCTOSOL HC) 2.5 % rectal cream Place 1 Application rectally 2 (two) times daily. Do not apply close or on vaginal area. (Patient not taking: Reported on 07/05/2023) 30 g 0   Prenatal Vit-Fe Fumarate-FA (PREPLUS) 27-1 MG TABS Take 1 tablet by mouth daily. (Patient not taking: Reported on 04/20/2022) 30 tablet 13   No current facility-administered medications on file prior to visit.    Allergies  Allergen Reactions   Lindane  Swelling    Social History   Socioeconomic History   Marital status: Single    Spouse name: Not on file   Number of children: 0   Years of education: Not on file   Highest education level: Some college, no degree  Occupational History   Not on file  Tobacco Use   Smoking status: Never    Passive exposure: Never   Smokeless tobacco: Never  Vaping Use   Vaping status: Every Day  Substance and Sexual Activity   Alcohol use: No   Drug use: No   Sexual activity: Yes    Birth control/protection: None  Other Topics Concern   Not on file  Social History Narrative   Not on file   Social Drivers of Health   Financial Resource Strain: Low Risk  (06/05/2024)   Overall Financial Resource Strain (CARDIA)    Difficulty of Paying  Living Expenses: Not very hard  Food Insecurity: No Food Insecurity (06/05/2024)   Hunger Vital Sign    Worried About Running Out of Food in the Last Year: Never true    Ran Out of Food in the Last Year: Never true  Transportation Needs: No Transportation Needs (06/05/2024)   PRAPARE - Administrator, Civil Service (Medical): No    Lack of Transportation (Non-Medical): No  Physical Activity: Inactive (06/05/2024)   Exercise Vital Sign    Days of Exercise per Week: 0 days    Minutes of Exercise per Session: Not on file  Stress: Stress Concern Present (06/06/2024)   [employer] of Occupational Health - Occupational Stress Questionnaire    Feeling of Stress: Rather much  Social Connections: Moderately Integrated (06/05/2024)   Social Connection and Isolation Panel    Frequency of Communication with Friends and Family: More than three times a week    Frequency of Social Gatherings with Friends and Family: Once a week    Attends Religious Services: More than 4 times per year    Active Member of [employer] or Organizations: No    Attends Engineer, structural: Not on file    Marital Status: Living with partner  Recent Concern: Social Connections - Moderately Isolated (04/03/2024)   Social Connection and Isolation Panel    Frequency of Communication with Friends and Family: More than three times a week  Frequency of Social Gatherings with Friends and Family: More than three times a week    Attends Religious Services: More than 4 times per year    Active Member of [employer] or Organizations: No    Attends Engineer, structural: Not on file    Marital Status: Never married  Intimate Partner Violence: Not At Risk (06/06/2024)   Humiliation, Afraid, Rape, and Kick questionnaire    Fear of Current or Ex-Partner: No    Emotionally Abused: No    Physically Abused: No    Sexually Abused: No    Family History  Problem Relation Age of Onset   Allergic rhinitis Mother    Diabetes  Father    Hypertension Father    Diabetes Maternal Grandmother    Diabetes Paternal Grandfather     Past Surgical History:  Procedure Laterality Date   INGUINAL HERNIA REPAIR      ROS: Review of Systems Negative except as stated above  PHYSICAL EXAM: BP 106/73   Pulse 83   Temp 98.6 F (37 C) (Oral)   Resp 16   Ht 5' 6 (1.676 m)   Wt 205 lb (93 kg)   LMP 05/14/2024 (Exact Date)   SpO2 97%   Breastfeeding No   BMI 33.09 kg/m   Physical Exam HENT:     Head: Normocephalic and atraumatic.     Nose: Nose normal.     Mouth/Throat:     Mouth: Mucous membranes are moist.     Pharynx: Oropharynx is clear.  Eyes:     Extraocular Movements: Extraocular movements intact.     Conjunctiva/sclera: Conjunctivae normal.     Pupils: Pupils are equal, round, and reactive to light.  Cardiovascular:     Rate and Rhythm: Normal rate and regular rhythm.     Pulses: Normal pulses.     Heart sounds: Normal heart sounds.  Pulmonary:     Effort: Pulmonary effort is normal.     Breath sounds: Normal breath sounds.  Musculoskeletal:        General: Normal range of motion.     Cervical back: Normal range of motion and neck supple.     Right hip: Normal.     Left hip: Normal.     Right upper leg: Normal.     Left upper leg: Normal.     Right knee: Normal.     Left knee: Normal.     Right lower leg: Normal.     Left lower leg: Normal.     Right ankle: Normal.     Left ankle: Normal.     Right foot: Normal.     Left foot: Normal.  Neurological:     General: No focal deficit present.     Mental Status: She is alert and oriented to person, place, and time.  Psychiatric:        Mood and Affect: Mood normal.        Behavior: Behavior normal.     ASSESSMENT AND PLAN: 1. Nonintractable headache, unspecified chronicity pattern, unspecified headache type (Primary) - Sumatriptan  as prescribed. Counseled on medication adherence/adverse effects.  - Patient decline referral to  Neurology.  - Follow-up with primary provider in 4 weeks or sooner if needed.  - SUMAtriptan  (IMITREX ) 25 MG tablet; Take 25 mg (1 tablet total) by mouth at the start of the headache. May repeat in 2 hours x 1 if headache persists. Max of 2 tablets/24 hours.  Dispense: 90 tablet; Refill: 1  2.  Pain in both lower extremities - Patient declined pharmacological therapy. - Routine screening.  - Follow-up with primary provider as scheduled. - VAS US  LOWER EXTREMITY VENOUS (DVT); Future  3. Pain in both feet - Patient declined pharmacological therapy.  - Patient declined referral to Podiatry.  Patient was given the opportunity to ask questions.  Patient verbalized understanding of the plan and was able to repeat key elements of the plan. Patient was given clear instructions to go to Emergency Department or return to medical center if symptoms don't improve, worsen, or new problems develop.The patient verbalized understanding.   Orders Placed This Encounter  Procedures   VAS US  LOWER EXTREMITY VENOUS (DVT)     Requested Prescriptions   Signed Prescriptions Disp Refills   SUMAtriptan  (IMITREX ) 25 MG tablet 90 tablet 1    Sig: Take 25 mg (1 tablet total) by mouth at the start of the headache. May repeat in 2 hours x 1 if headache persists. Max of 2 tablets/24 hours.    Follow-up with primary provider as scheduled.  Greig JINNY Drones, NP

## 2024-06-12 ENCOUNTER — Ambulatory Visit (HOSPITAL_COMMUNITY)
Admission: RE | Admit: 2024-06-12 | Discharge: 2024-06-12 | Disposition: A | Discharge status: Home / Self Care | Source: Ambulatory Visit | Attending: Family | Admitting: Family

## 2024-06-12 DIAGNOSIS — M79605 Pain in left leg: Secondary | ICD-10-CM | POA: Insufficient documentation

## 2024-06-12 DIAGNOSIS — M79604 Pain in right leg: Secondary | ICD-10-CM | POA: Diagnosis not present

## 2024-06-13 DIAGNOSIS — F419 Anxiety disorder, unspecified: Secondary | ICD-10-CM | POA: Diagnosis not present

## 2024-06-17 ENCOUNTER — Ambulatory Visit: Payer: Self-pay | Admitting: Family

## 2024-06-20 DIAGNOSIS — F419 Anxiety disorder, unspecified: Secondary | ICD-10-CM | POA: Diagnosis not present

## 2024-06-27 DIAGNOSIS — F419 Anxiety disorder, unspecified: Secondary | ICD-10-CM | POA: Diagnosis not present

## 2024-07-09 DIAGNOSIS — F419 Anxiety disorder, unspecified: Secondary | ICD-10-CM | POA: Diagnosis not present

## 2024-07-14 ENCOUNTER — Encounter: Admitting: Family

## 2024-07-14 NOTE — Progress Notes (Signed)
 Erroneous encounter-disregard

## 2024-07-16 DIAGNOSIS — F419 Anxiety disorder, unspecified: Secondary | ICD-10-CM | POA: Diagnosis not present

## 2024-07-23 DIAGNOSIS — F419 Anxiety disorder, unspecified: Secondary | ICD-10-CM | POA: Diagnosis not present

## 2024-07-30 DIAGNOSIS — F419 Anxiety disorder, unspecified: Secondary | ICD-10-CM | POA: Diagnosis not present

## 2024-08-15 ENCOUNTER — Encounter: Payer: Self-pay | Admitting: Family

## 2024-08-19 NOTE — Telephone Encounter (Signed)
 Schedule appointment?

## 2024-08-26 ENCOUNTER — Ambulatory Visit

## 2024-08-26 ENCOUNTER — Ambulatory Visit: Payer: Self-pay

## 2024-08-26 NOTE — Telephone Encounter (Signed)
 FYI Only or Action Required?: Action required by provider: request for appointment and clinical question for provider.  Patient was last seen in primary care on 08/26/2024 by Leavy Rode, [place], PA-C.  Called Nurse Triage reporting Anxiety.  Symptoms began worse for the last 2 weeks.  Symptoms are: gradually worsening.  Triage Disposition: See PCP Within 2 Weeks  Patient/caregiver understands and will follow disposition?: No, wishes to speak with PCP       Copied from CRM #[id number]. Topic: Clinical - Red Word Triage >> Aug 26, 2024  1:53 PM Rosaria A wrote: [facility] that prompted transfer to Nurse Triage: Patient called in regarding increased anxiety.        Reason for Disposition  [1] Symptoms of anxiety or panic attack AND [2] is a chronic symptom (recurrent or ongoing AND present > 4 weeks)  Answer Assessment - Initial Assessment Questions Patient needs an appointment with Greig Drones for her FMLA paperwork but there are no appointments available until December 5th. Patient would like to know if she can be worked in or what other options she has to have this paperwork filled out. Please advise.      1. CONCERN: Did anything happen that prompted you to call today?      Increased anxiety  2. ANXIETY SYMPTOMS: Can you describe how you (your loved one; patient) have been feeling? (e.g., tense, restless, panicky, anxious, keyed up, overwhelmed, sense of impending doom).      Anxious  3. ONSET: How long have you been feeling this way? (e.g., hours, days, weeks)     Ongoing problem but worsening for 2 weeks 4. SEVERITY: How would you rate the level of anxiety? (e.g., 0 - 10; or mild, moderate, severe).     Moderate to severe  5. FUNCTIONAL IMPAIRMENT: How have these feelings affected your ability to do daily activities? Have you had more difficulty than usual doing your normal daily activities? (e.g., getting better, same, worse; self-care, school, work,  interactions)     Having difficulty with work due to her anxiety  6. HISTORY: Have you felt this way before? Have you ever been diagnosed with an anxiety problem in the past? (e.g., generalized anxiety disorder, panic attacks, PTSD). If Yes, ask: How was this problem treated? (e.g., medicines, counseling, etc.)     Yes 7. RISK OF HARM - SUICIDAL IDEATION: Do you ever have thoughts of hurting or killing yourself? If Yes, ask:  Do you have these feelings now? Do you have a plan on how you would do this?     No 8. TREATMENT:  What has been done so far to treat this anxiety? (e.g., medicines, relaxation strategies). What has helped?     Not currently on medication  9. THERAPIST: Do you have a counselor or therapist? If Yes, ask: What is their name?     Sees a therapist  11. PATIENT SUPPORT: Who is with you now? Who do you live with? Do you have family or friends who you can talk to?        Has family and friends  61. OTHER SYMPTOMS: Do you have any other symptoms? (e.g., feeling depressed, trouble concentrating, trouble sleeping, trouble breathing, palpitations or fast heartbeat, chest pain, sweating, nausea, or diarrhea)       Not currently  13. PREGNANCY: Is there any chance you are pregnant? When was your last menstrual period?       No  Protocols used: Anxiety and Panic Attack-A-AH

## 2024-08-27 ENCOUNTER — Ambulatory Visit: Admitting: Nurse Practitioner

## 2024-08-27 DIAGNOSIS — F419 Anxiety disorder, unspecified: Secondary | ICD-10-CM | POA: Diagnosis not present

## 2024-08-28 NOTE — Telephone Encounter (Signed)
 Noted. I removed Greig Drones, NP as pt pcp

## 2024-08-29 NOTE — Telephone Encounter (Signed)
 Please place patient on wait list for sooner appointment should one become available.

## 2024-08-29 NOTE — Telephone Encounter (Signed)
 Scheduled for acute appt

## 2024-09-01 ENCOUNTER — Ambulatory Visit (INDEPENDENT_AMBULATORY_CARE_PROVIDER_SITE_OTHER): Admitting: Family

## 2024-09-01 ENCOUNTER — Encounter: Payer: Self-pay | Admitting: Family

## 2024-09-01 ENCOUNTER — Other Ambulatory Visit (HOSPITAL_COMMUNITY)
Admission: RE | Admit: 2024-09-01 | Discharge: 2024-09-01 | Disposition: A | Discharge status: Home / Self Care | Source: Ambulatory Visit | Attending: Family | Admitting: Family

## 2024-09-01 VITALS — BP 126/81 | HR 82 | Temp 99.0°F | Resp 16 | Ht 66.0 in | Wt 207.2 lb

## 2024-09-01 DIAGNOSIS — Z113 Encounter for screening for infections with a predominantly sexual mode of transmission: Secondary | ICD-10-CM | POA: Diagnosis not present

## 2024-09-01 DIAGNOSIS — F32A Depression, unspecified: Secondary | ICD-10-CM

## 2024-09-01 DIAGNOSIS — F419 Anxiety disorder, unspecified: Secondary | ICD-10-CM

## 2024-09-01 DIAGNOSIS — Z0289 Encounter for other administrative examinations: Secondary | ICD-10-CM

## 2024-09-01 MED ORDER — HYDROXYZINE PAMOATE 25 MG PO CAPS: 25.0000 mg | ORAL_CAPSULE | Freq: Three times a day (TID) | ORAL | 1 refills | Status: DC | PRN

## 2024-09-01 MED ORDER — FLUOXETINE HCL 10 MG PO CAPS: 10.0000 mg | ORAL_CAPSULE | Freq: Every day | ORAL | 0 refills | Status: DC

## 2024-09-01 NOTE — Progress Notes (Signed)
 Patient ID: Tiffany Dennis, female    DOB: 08-Oct-2001  MRN: [id number]  CC: Anxiety   Subjective: Tiffany Dennis is a 23 y.o. female who presents for anxiety.   Her concerns today include:  - Anxiety depression related to work-life balance and school. She would like to try medication to see if this helps. Established with therapist and was told to see Primary Care for completion of FMLA. She denies thoughts of self-harm, suicidal ideations, homicidal ideations. - Routine STD testing. Denies symptoms/recent exposure. States thinks she has a yeast infection.  Patient Active Problem List   Diagnosis Date Noted   Generalized anxiety disorder 09/30/2019   Intrinsic eczema 05/29/2017   Instability of left shoulder joint 03/13/2017   Frequent headaches 10/26/2015     Current Outpatient Medications on File Prior to Visit  Medication Sig Dispense Refill   hydrocortisone  (PROCTOSOL HC) 2.5 % rectal cream Place 1 Application rectally 2 (two) times daily. Do not apply close or on vaginal area. (Patient not taking: Reported on 07/05/2023) 30 g 0   Prenatal Vit-Fe Fumarate-FA (PREPLUS) 27-1 MG TABS Take 1 tablet by mouth daily. (Patient not taking: Reported on 04/20/2022) 30 tablet 13   SUMAtriptan  (IMITREX ) 25 MG tablet Take 25 mg (1 tablet total) by mouth at the start of the headache. May repeat in 2 hours x 1 if headache persists. Max of 2 tablets/24 hours. (Patient not taking: Reported on 09/01/2024) 90 tablet 1   No current facility-administered medications on file prior to visit.    Allergies  Allergen Reactions   Lindane  Swelling    Social History   Socioeconomic History   Marital status: Single    Spouse name: Not on file   Number of children: 0   Years of education: Not on file   Highest education level: Some college, no degree  Occupational History   Not on file  Tobacco Use   Smoking status: Never    Passive exposure: Never   Smokeless tobacco: Never  Vaping Use   Vaping  status: Every Day  Substance and Sexual Activity   Alcohol use: No   Drug use: No   Sexual activity: Yes    Birth control/protection: None  Other Topics Concern   Not on file  Social History Narrative   Not on file   Social Drivers of Health   Financial Resource Strain: Low Risk  (08/26/2024)   Overall Financial Resource Strain (CARDIA)    Difficulty of Paying Living Expenses: Not very hard  Food Insecurity: Food Insecurity Present (08/26/2024)   Hunger Vital Sign    Worried About Running Out of Food in the Last Year: Sometimes true    Ran Out of Food in the Last Year: Never true  Transportation Needs: No Transportation Needs (08/26/2024)   PRAPARE - Administrator, Civil Service (Medical): No    Lack of Transportation (Non-Medical): No  Physical Activity: Insufficiently Active (08/26/2024)   Exercise Vital Sign    Days of Exercise per Week: 1 day    Minutes of Exercise per Session: 40 min  Stress: Stress Concern Present (08/26/2024)   [employer] of Occupational Health - Occupational Stress Questionnaire    Feeling of Stress: Very much  Social Connections: Moderately Isolated (08/26/2024)   Social Connection and Isolation Panel    Frequency of Communication with Friends and Family: Three times a week    Frequency of Social Gatherings with Friends and Family: Once a week  Attends Religious Services: More than 4 times per year    Active Member of Clubs or Organizations: No    Attends Banker Meetings: Not on file    Marital Status: Never married  Intimate Partner Violence: Not At Risk (06/06/2024)   Humiliation, Afraid, Rape, and Kick questionnaire    Fear of Current or Ex-Partner: No    Emotionally Abused: No    Physically Abused: No    Sexually Abused: No    Family History  Problem Relation Age of Onset   Allergic rhinitis Mother    Diabetes Father    Hypertension Father    Diabetes Maternal Grandmother    Diabetes Paternal  Grandfather     Past Surgical History:  Procedure Laterality Date   INGUINAL HERNIA REPAIR      ROS: Review of Systems Negative except as stated above  PHYSICAL EXAM: BP 126/81   Pulse 82   Temp 99 F (37.2 C) (Oral)   Resp 16   Ht 5' 6 (1.676 m)   Wt 207 lb 3.2 oz (94 kg)   LMP 08/11/2024 (Exact Date)   SpO2 97%   BMI 33.44 kg/m   Physical Exam HENT:     Head: Normocephalic and atraumatic.     Nose: Nose normal.     Mouth/Throat:     Mouth: Mucous membranes are moist.     Pharynx: Oropharynx is clear.  Eyes:     Extraocular Movements: Extraocular movements intact.     Conjunctiva/sclera: Conjunctivae normal.     Pupils: Pupils are equal, round, and reactive to light.  Cardiovascular:     Rate and Rhythm: Normal rate and regular rhythm.     Pulses: Normal pulses.     Heart sounds: Normal heart sounds.  Pulmonary:     Effort: Pulmonary effort is normal.     Breath sounds: Normal breath sounds.  Musculoskeletal:        General: Normal range of motion.     Cervical back: Normal range of motion and neck supple.  Neurological:     General: No focal deficit present.     Mental Status: She is alert and oriented to person, place, and time.  Psychiatric:        Mood and Affect: Mood normal.        Behavior: Behavior normal.     ASSESSMENT AND PLAN: 1. Encounter for completion of form with patient (Primary) 2. Anxiety and depression - Patient denies thoughts of self-harm, suicidal ideations, homicidal ideations. - FMLA completed today in office. - Fluoxetine and Hydroxyzine as prescribed. Counseled on medication adherence/adverse effects.  - Patient declined referral to Psychiatry.  - Keep all scheduled appointments with established therapist.  - Follow-up with primary provider in 4 weeks or sooner if needed.  - FLUoxetine (PROZAC) 10 MG capsule; Take 1 capsule (10 mg total) by mouth daily.  Dispense: 90 capsule; Refill: 0 - hydrOXYzine (VISTARIL) 25 MG  capsule; Take 1 capsule (25 mg total) by mouth every 8 (eight) hours as needed.  Dispense: 90 capsule; Refill: 1  3. Routine screening for STI (sexually transmitted infection) - Routine screening.  - Cervicovaginal ancillary only   Patient was given the opportunity to ask questions.  Patient verbalized understanding of the plan and was able to repeat key elements of the plan. Patient was given clear instructions to go to Emergency Department or return to medical center if symptoms don't improve, worsen, or new problems develop.The patient verbalized understanding.  Requested Prescriptions   Signed Prescriptions Disp Refills   FLUoxetine (PROZAC) 10 MG capsule 90 capsule 0    Sig: Take 1 capsule (10 mg total) by mouth daily.   hydrOXYzine (VISTARIL) 25 MG capsule 90 capsule 1    Sig: Take 1 capsule (25 mg total) by mouth every 8 (eight) hours as needed.    Return for Follow-up as needed.  Greig JINNY Drones, NP

## 2024-09-01 NOTE — Progress Notes (Signed)
 Anxiety, patient has some FMLA paperwork that she needs filled out, patient wants to be tested for STI, patient scored a 9 on GAD-7

## 2024-09-03 ENCOUNTER — Telehealth: Payer: Self-pay | Admitting: Family

## 2024-09-03 DIAGNOSIS — F419 Anxiety disorder, unspecified: Secondary | ICD-10-CM | POA: Diagnosis not present

## 2024-09-03 LAB — CERVICOVAGINAL ANCILLARY ONLY
Bacterial Vaginitis (gardnerella): POSITIVE — AB
Candida Glabrata: NEGATIVE
Candida Vaginitis: POSITIVE — AB
Chlamydia: NEGATIVE
Comment: NEGATIVE
Comment: NEGATIVE
Comment: NEGATIVE
Comment: NEGATIVE
Comment: NEGATIVE
Comment: NORMAL
Neisseria Gonorrhea: NEGATIVE
Trichomonas: NEGATIVE

## 2024-09-03 NOTE — Telephone Encounter (Signed)
 A document form has been faxed: Medical inquiry form related to an accommodation request, to be filled out by provider. Send document back via Fax within 7-days to 14 days. Document is located in providers tray at front office.          Fax number: [phone]

## 2024-09-04 ENCOUNTER — Ambulatory Visit: Payer: Self-pay | Admitting: Family

## 2024-09-04 DIAGNOSIS — B9689 Other specified bacterial agents as the cause of diseases classified elsewhere: Secondary | ICD-10-CM

## 2024-09-04 DIAGNOSIS — B3731 Acute candidiasis of vulva and vagina: Secondary | ICD-10-CM

## 2024-09-04 MED ORDER — METRONIDAZOLE 500 MG PO TABS: 500.0000 mg | ORAL_TABLET | Freq: Two times a day (BID) | ORAL | 0 refills | Status: AC

## 2024-09-04 MED ORDER — FLUCONAZOLE 150 MG PO TABS: 150.0000 mg | ORAL_TABLET | ORAL | 0 refills | Status: AC

## 2024-09-10 DIAGNOSIS — F419 Anxiety disorder, unspecified: Secondary | ICD-10-CM | POA: Diagnosis not present

## 2024-09-17 DIAGNOSIS — F419 Anxiety disorder, unspecified: Secondary | ICD-10-CM | POA: Diagnosis not present

## 2024-09-19 ENCOUNTER — Ambulatory Visit (INDEPENDENT_AMBULATORY_CARE_PROVIDER_SITE_OTHER): Admitting: Family

## 2024-09-19 VITALS — BP 107/75 | HR 83 | Temp 98.2°F | Resp 16 | Ht 67.0 in | Wt 205.0 lb

## 2024-09-19 DIAGNOSIS — Z0289 Encounter for other administrative examinations: Secondary | ICD-10-CM | POA: Diagnosis not present

## 2024-09-19 DIAGNOSIS — R519 Headache, unspecified: Secondary | ICD-10-CM | POA: Diagnosis not present

## 2024-09-19 DIAGNOSIS — F32A Depression, unspecified: Secondary | ICD-10-CM

## 2024-09-19 DIAGNOSIS — F419 Anxiety disorder, unspecified: Secondary | ICD-10-CM | POA: Diagnosis not present

## 2024-09-19 MED ORDER — SUMATRIPTAN SUCCINATE 25 MG PO TABS: ORAL_TABLET | ORAL | 1 refills | Status: DC

## 2024-09-19 NOTE — Progress Notes (Signed)
 Patient ID: Tiffany Dennis, female    DOB: 2001-01-27  MRN: [id number]  CC: Paperwork  Subjective: Tiffany Dennis is a 23 y.o. female who presents for paperwork.   Her concerns today include:  - Patient states she has not been employed long enough at her company to qualify for [employer]. States instead she needs ADA form completed. She denies thoughts of self-harm, suicidal ideations, homicidal ideations. - States she never picked up Sumatriptan  from pharmacy and needs refill sent again. Denies red flag symptoms associated with headaches.   Patient Active Problem List   Diagnosis Date Noted   Generalized anxiety disorder 09/30/2019   Intrinsic eczema 05/29/2017   Instability of left shoulder joint 03/13/2017   Frequent headaches 10/26/2015     Current Outpatient Medications on File Prior to Visit  Medication Sig Dispense Refill   FLUoxetine  (PROZAC ) 10 MG capsule Take 1 capsule (10 mg total) by mouth daily. 90 capsule 0   hydrOXYzine  (VISTARIL ) 25 MG capsule Take 1 capsule (25 mg total) by mouth every 8 (eight) hours as needed. 90 capsule 1   hydrocortisone  (PROCTOSOL HC) 2.5 % rectal cream Place 1 Application rectally 2 (two) times daily. Do not apply close or on vaginal area. (Patient not taking: Reported on 07/05/2023) 30 g 0   Prenatal Vit-Fe Fumarate-FA (PREPLUS) 27-1 MG TABS Take 1 tablet by mouth daily. (Patient not taking: Reported on 04/20/2022) 30 tablet 13   No current facility-administered medications on file prior to visit.    Allergies  Allergen Reactions   Lindane  Swelling    Social History   Socioeconomic History   Marital status: Single    Spouse name: Not on file   Number of children: 0   Years of education: Not on file   Highest education level: Some college, no degree  Occupational History   Not on file  Tobacco Use   Smoking status: Never    Passive exposure: Never   Smokeless tobacco: Never  Vaping Use   Vaping status: Every Day  Substance and Sexual  Activity   Alcohol use: No   Drug use: No   Sexual activity: Yes    Birth control/protection: None  Other Topics Concern   Not on file  Social History Narrative   Not on file   Social Drivers of Health   Financial Resource Strain: Low Risk  (08/26/2024)   Overall Financial Resource Strain (CARDIA)    Difficulty of Paying Living Expenses: Not very hard  Food Insecurity: Food Insecurity Present (08/26/2024)   Hunger Vital Sign    Worried About Running Out of Food in the Last Year: Sometimes true    Ran Out of Food in the Last Year: Never true  Transportation Needs: No Transportation Needs (08/26/2024)   PRAPARE - Administrator, Civil Service (Medical): No    Lack of Transportation (Non-Medical): No  Physical Activity: Insufficiently Active (08/26/2024)   Exercise Vital Sign    Days of Exercise per Week: 1 day    Minutes of Exercise per Session: 40 min  Stress: Stress Concern Present (08/26/2024)   [employer] of Occupational Health - Occupational Stress Questionnaire    Feeling of Stress: Very much  Social Connections: Moderately Isolated (08/26/2024)   Social Connection and Isolation Panel    Frequency of Communication with Friends and Family: Three times a week    Frequency of Social Gatherings with Friends and Family: Once a week    Attends Religious Services: More than  4 times per year    Active Member of Clubs or Organizations: No    Attends Banker Meetings: Not on file    Marital Status: Never married  Intimate Partner Violence: Not At Risk (06/06/2024)   Humiliation, Afraid, Rape, and Kick questionnaire    Fear of Current or Ex-Partner: No    Emotionally Abused: No    Physically Abused: No    Sexually Abused: No    Family History  Problem Relation Age of Onset   Allergic rhinitis Mother    Diabetes Father    Hypertension Father    Diabetes Maternal Grandmother    Diabetes Paternal Grandfather     Past Surgical History:   Procedure Laterality Date   INGUINAL HERNIA REPAIR      ROS: Review of Systems Negative except as stated above  PHYSICAL EXAM: BP 107/75   Pulse 83   Temp 98.2 F (36.8 C) (Oral)   Resp 16   Ht 5' 7 (1.702 m)   Wt 205 lb (93 kg)   LMP 08/11/2024 (Exact Date)   SpO2 98%   BMI 32.11 kg/m   Physical Exam HENT:     Head: Normocephalic and atraumatic.     Nose: Nose normal.     Mouth/Throat:     Mouth: Mucous membranes are moist.     Pharynx: Oropharynx is clear.  Eyes:     Extraocular Movements: Extraocular movements intact.     Conjunctiva/sclera: Conjunctivae normal.     Pupils: Pupils are equal, round, and reactive to light.  Cardiovascular:     Rate and Rhythm: Normal rate and regular rhythm.     Pulses: Normal pulses.     Heart sounds: Normal heart sounds.  Pulmonary:     Effort: Pulmonary effort is normal.     Breath sounds: Normal breath sounds.  Musculoskeletal:        General: Normal range of motion.     Cervical back: Normal range of motion and neck supple.  Neurological:     General: No focal deficit present.     Mental Status: She is alert and oriented to person, place, and time.  Psychiatric:        Mood and Affect: Mood normal.        Behavior: Behavior normal.     ASSESSMENT AND PLAN: 1. Encounter for completion of form with patient (Primary) 2. Anxiety and depression - Patient denies thoughts of self-harm, suicidal ideations, homicidal ideations. - Medical Inquiry Form Related To An Accommodation Request form completed today in office. Beginning date 09/19/2024. Ending date 03/19/2025. - Continue Fluoxetine  and Hydroxyzine  as prescribed. Counseled on medication adherence/adverse effects. No refills needed as of present.  - Patient declined referral to Psychiatry.  - Keep all scheduled appointments with established therapist.  - Follow-up with primary provider as scheduled.  3. Nonintractable headache, unspecified chronicity pattern,  unspecified headache type - Sumatriptan  as prescribed. Counseled on medication adherence/adverse effects.  - Follow-up with primary provider as scheduled.  - SUMAtriptan  (IMITREX ) 25 MG tablet; Take 25 mg (1 tablet total) by mouth at the start of the headache. May repeat in 2 hours x 1 if headache persists. Max of 2 tablets/24 hours.  Dispense: 90 tablet; Refill: 1   Patient was given the opportunity to ask questions.  Patient verbalized understanding of the plan and was able to repeat key elements of the plan. Patient was given clear instructions to go to Emergency Department or return to medical center if  symptoms don't improve, worsen, or new problems develop.The patient verbalized understanding.    Requested Prescriptions   Signed Prescriptions Disp Refills   SUMAtriptan  (IMITREX ) 25 MG tablet 90 tablet 1    Sig: Take 25 mg (1 tablet total) by mouth at the start of the headache. May repeat in 2 hours x 1 if headache persists. Max of 2 tablets/24 hours.    Return for Follow-up as needed.  [name], NP

## 2024-09-22 NOTE — Telephone Encounter (Signed)
 Patient came in on 09/19/2024 and had form filled out

## 2024-09-24 ENCOUNTER — Telehealth: Payer: Self-pay | Admitting: Family

## 2024-09-24 NOTE — Telephone Encounter (Signed)
 A document form has been faxed: Additional information requested for disability form, to be filled out by provider. Send document back via Fax within 7-days to 14 days. Document is located in providers tray at front office.          Fax number: [phone]

## 2024-10-01 DIAGNOSIS — F419 Anxiety disorder, unspecified: Secondary | ICD-10-CM | POA: Diagnosis not present

## 2024-10-06 ENCOUNTER — Ambulatory Visit: Admitting: Family

## 2024-10-08 DIAGNOSIS — F419 Anxiety disorder, unspecified: Secondary | ICD-10-CM | POA: Diagnosis not present

## 2024-10-10 NOTE — Telephone Encounter (Signed)
 Called pt to schedule appt for paperwork needing additional information for disability; no appts available until January 14th. Pt stated she needs paperwork to get sent out as soon as possible. Does pt still need an OV for this form to be filled or can pt be seen sooner? Please advise.

## 2024-10-10 NOTE — Telephone Encounter (Signed)
 Please place patient on wait list should sooner appointment become available.

## 2024-10-14 NOTE — Telephone Encounter (Signed)
Pt placed on wait list

## 2024-10-15 DIAGNOSIS — N939 Abnormal uterine and vaginal bleeding, unspecified: Secondary | ICD-10-CM | POA: Diagnosis not present

## 2024-10-16 DIAGNOSIS — F419 Anxiety disorder, unspecified: Secondary | ICD-10-CM | POA: Diagnosis not present

## 2024-10-28 ENCOUNTER — Other Ambulatory Visit: Payer: Self-pay

## 2024-10-28 ENCOUNTER — Telehealth (INDEPENDENT_AMBULATORY_CARE_PROVIDER_SITE_OTHER): Payer: Self-pay | Admitting: Family

## 2024-10-28 DIAGNOSIS — R519 Headache, unspecified: Secondary | ICD-10-CM

## 2024-10-28 DIAGNOSIS — F419 Anxiety disorder, unspecified: Secondary | ICD-10-CM

## 2024-10-28 DIAGNOSIS — Z0289 Encounter for other administrative examinations: Secondary | ICD-10-CM

## 2024-10-28 DIAGNOSIS — F32A Depression, unspecified: Secondary | ICD-10-CM

## 2024-10-28 MED ORDER — SUMATRIPTAN SUCCINATE 25 MG PO TABS: ORAL_TABLET | ORAL | 1 refills | Status: AC

## 2024-10-28 MED ORDER — HYDROXYZINE PAMOATE 25 MG PO CAPS: 25.0000 mg | ORAL_CAPSULE | Freq: Three times a day (TID) | ORAL | 1 refills | Status: AC | PRN

## 2024-10-28 MED ORDER — FLUOXETINE HCL 10 MG PO CAPS: 10.0000 mg | ORAL_CAPSULE | Freq: Every day | ORAL | 0 refills | Status: AC

## 2024-10-28 NOTE — Progress Notes (Signed)
 Virtual Visit via Video Note  I connected with Tiffany Dennis, on 10/28/2024 at 12:57 PM by video and verified that I am speaking with the correct person using two identifiers.  Consent: I discussed the limitations, risks, security and privacy concerns of performing an evaluation and management service by video and the availability of in person appointments. I also discussed with the patient that there may be a patient responsible charge related to this service. The patient expressed understanding and agreed to proceed.   Location of Patient: Home  Location of Provider: [facility]   Persons participating in Telemedicine visit: Ronny Z Dillenbeck Greig Chute, NP Purvis Pepper, CMA  History of Present Illness: - Patient states her employer faxed additional FMLA paperwork to our office for completion. Patient states she isn't sure what the additional FMLA paperwork includes. Patient states she isn't sure if previously completed FMLA from office visit on 09/19/2024 was approved or denied from her employer. She is doing well on Fluoxetine  and Hydroxyzine . She denies thoughts of self-harm, suicidal ideations, homicidal ideations. - Doing well on Sumatriptan , no issues/concerns. Denies red flag symptoms associated with headaches.   Past Medical History:  Diagnosis Date   Allergy    Intrinsic eczema    Allergies[1]  Medications Ordered Prior to Encounter[2]  Observations/Objective: Alert and oriented x 3. Not in acute distress. Physical examination not completed as this is a telemedicine visit.  Assessment and Plan: 1. Encounter for completion of form with patient (Primary) 2. Anxiety and depression - Patient denies thoughts of self-harm, suicidal ideations, homicidal ideations. - I discussed with patient in detail I will ask Purvis Pepper, CMA to check to see if patient's FMLA paperwork has been received by fax and will complete at that time. Patient  verbalized understanding/agreement.  - Follow-up with primary provider as scheduled.  - hydrOXYzine  (VISTARIL ) 25 MG capsule; Take 1 capsule (25 mg total) by mouth every 8 (eight) hours as needed.  Dispense: 90 capsule; Refill: 1 - FLUoxetine  (PROZAC ) 10 MG capsule; Take 1 capsule (10 mg total) by mouth daily.  Dispense: 90 capsule; Refill: 0  3. Nonintractable headache, unspecified chronicity pattern, unspecified headache type - Continue Sumatriptan  as prescribed. Counseled on medication adherence/adverse effects.  - Follow-up with  primary provider in 3 months or sooner if needed.  - SUMAtriptan  (IMITREX ) 25 MG tablet; Take 25 mg (1 tablet total) by mouth at the start of the headache. May repeat in 2 hours x 1 if headache persists. Max of 2 tablets/24 hours.  Dispense: 90 tablet; Refill: 1  Follow Up Instructions: Follow-up with primary provider as scheduled.    Patient was given clear instructions to go to Emergency Department or return to medical center if symptoms don't improve, worsen, or new problems develop.The patient verbalized understanding.  I discussed the assessment and treatment plan with the patient. The patient was provided an opportunity to ask questions and all were answered. The patient agreed with the plan and demonstrated an understanding of the instructions.   The patient was advised to call back or seek an in-person evaluation if the symptoms worsen or if the condition fails to improve as anticipated.     I provided 5 minutes total of non-face-to-face time during this encounter.   [name], NP  [facility], [place] [phone] 10/28/2024, 12:57 PM     [1]  Allergies Allergen Reactions   Lindane  Swelling  [2]  Current Outpatient Medications  on File Prior to Visit  Medication Sig Dispense Refill   FLUoxetine  (PROZAC ) 10 MG capsule Take 1 capsule (10 mg total) by mouth daily. 90 capsule 0   hydrocortisone   (PROCTOSOL HC) 2.5 % rectal cream Place 1 Application rectally 2 (two) times daily. Do not apply close or on vaginal area. (Patient not taking: Reported on 07/05/2023) 30 g 0   hydrOXYzine  (VISTARIL ) 25 MG capsule Take 1 capsule (25 mg total) by mouth every 8 (eight) hours as needed. 90 capsule 1   Prenatal Vit-Fe Fumarate-FA (PREPLUS) 27-1 MG TABS Take 1 tablet by mouth daily. (Patient not taking: Reported on 04/20/2022) 30 tablet 13   SUMAtriptan  (IMITREX ) 25 MG tablet Take 25 mg (1 tablet total) by mouth at the start of the headache. May repeat in 2 hours x 1 if headache persists. Max of 2 tablets/24 hours. 90 tablet 1   No current facility-administered medications on file prior to visit.

## 2024-10-29 DIAGNOSIS — F419 Anxiety disorder, unspecified: Secondary | ICD-10-CM | POA: Diagnosis not present

## 2024-12-05 ENCOUNTER — Other Ambulatory Visit: Payer: Self-pay | Admitting: Family

## 2024-12-05 ENCOUNTER — Encounter: Payer: Self-pay | Admitting: Family

## 2024-12-05 DIAGNOSIS — E282 Polycystic ovarian syndrome: Secondary | ICD-10-CM

## 2024-12-05 NOTE — Telephone Encounter (Signed)
 Complete
